# Patient Record
Sex: Female | Born: 1945 | Race: Black or African American | Hispanic: No | Marital: Married | Smoking: Former smoker
Health system: Southern US, Community
[De-identification: ages and names within clinical notes are randomized; demographics above are authoritative.]

## PROBLEM LIST (undated history)

## (undated) DIAGNOSIS — E785 Hyperlipidemia, unspecified: Secondary | ICD-10-CM

## (undated) DIAGNOSIS — J302 Other seasonal allergic rhinitis: Secondary | ICD-10-CM

## (undated) DIAGNOSIS — Z86018 Personal history of other benign neoplasm: Secondary | ICD-10-CM

## (undated) DIAGNOSIS — I10 Essential (primary) hypertension: Secondary | ICD-10-CM

## (undated) DIAGNOSIS — M659 Unspecified synovitis and tenosynovitis, unspecified site: Secondary | ICD-10-CM

## (undated) DIAGNOSIS — K649 Unspecified hemorrhoids: Secondary | ICD-10-CM

## (undated) DIAGNOSIS — R0600 Dyspnea, unspecified: Secondary | ICD-10-CM

## (undated) DIAGNOSIS — D126 Benign neoplasm of colon, unspecified: Secondary | ICD-10-CM

## (undated) DIAGNOSIS — J449 Chronic obstructive pulmonary disease, unspecified: Secondary | ICD-10-CM

## (undated) DIAGNOSIS — M064 Inflammatory polyarthropathy: Secondary | ICD-10-CM

## (undated) DIAGNOSIS — J45909 Unspecified asthma, uncomplicated: Secondary | ICD-10-CM

## (undated) HISTORY — PX: COLONOSCOPY: SHX174

## (undated) HISTORY — PX: EYE SURGERY: SHX253

## (undated) HISTORY — PX: ABDOMINAL HYSTERECTOMY: SHX81

## (undated) HISTORY — PX: OTHER SURGICAL HISTORY: SHX169

## (undated) HISTORY — PX: CATARACT EXTRACTION, BILATERAL: SHX1313

## (undated) HISTORY — PX: NASAL SINUS SURGERY: SHX719

---

## 1993-11-03 HISTORY — PX: NASAL SINUS SURGERY: SHX719

## 2004-03-22 ENCOUNTER — Other Ambulatory Visit: Payer: Self-pay

## 2005-06-10 ENCOUNTER — Emergency Department: Payer: Self-pay | Admitting: Emergency Medicine

## 2006-03-20 ENCOUNTER — Emergency Department: Payer: Self-pay | Admitting: Emergency Medicine

## 2007-09-16 ENCOUNTER — Ambulatory Visit: Payer: Self-pay | Admitting: Family Medicine

## 2008-07-31 ENCOUNTER — Ambulatory Visit: Payer: Self-pay | Admitting: Rheumatology

## 2009-03-07 ENCOUNTER — Emergency Department: Payer: Self-pay | Admitting: Emergency Medicine

## 2009-09-20 ENCOUNTER — Ambulatory Visit: Payer: Self-pay | Admitting: Gastroenterology

## 2010-07-02 ENCOUNTER — Ambulatory Visit: Payer: Self-pay | Admitting: Family Medicine

## 2011-03-07 ENCOUNTER — Ambulatory Visit: Payer: Self-pay | Admitting: Anesthesiology

## 2011-03-07 DIAGNOSIS — I10 Essential (primary) hypertension: Secondary | ICD-10-CM

## 2011-03-13 ENCOUNTER — Ambulatory Visit: Payer: Self-pay | Admitting: Podiatry

## 2011-07-15 ENCOUNTER — Encounter: Payer: Self-pay | Admitting: Rheumatology

## 2011-08-04 ENCOUNTER — Encounter: Payer: Self-pay | Admitting: Rheumatology

## 2011-10-01 ENCOUNTER — Ambulatory Visit: Payer: Self-pay | Admitting: Family Medicine

## 2012-12-07 ENCOUNTER — Ambulatory Visit: Payer: Self-pay | Admitting: Family Medicine

## 2015-01-24 ENCOUNTER — Ambulatory Visit: Payer: Self-pay | Admitting: Family Medicine

## 2015-04-16 ENCOUNTER — Ambulatory Visit: Admission: RE | Admit: 2015-04-16 | Payer: Self-pay | Source: Ambulatory Visit | Admitting: Gastroenterology

## 2015-04-16 ENCOUNTER — Encounter: Admission: RE | Payer: Self-pay | Source: Ambulatory Visit

## 2015-04-16 SURGERY — COLONOSCOPY
Anesthesia: General

## 2015-10-09 ENCOUNTER — Ambulatory Visit: Admission: RE | Admit: 2015-10-09 | Payer: PPO | Source: Ambulatory Visit | Admitting: Gastroenterology

## 2015-10-09 ENCOUNTER — Encounter: Admission: RE | Payer: Self-pay | Source: Ambulatory Visit

## 2015-10-09 SURGERY — COLONOSCOPY WITH PROPOFOL
Anesthesia: General

## 2016-02-05 ENCOUNTER — Other Ambulatory Visit: Payer: Self-pay | Admitting: Orthopedic Surgery

## 2016-02-05 DIAGNOSIS — M5416 Radiculopathy, lumbar region: Secondary | ICD-10-CM

## 2016-02-26 ENCOUNTER — Ambulatory Visit
Admission: RE | Admit: 2016-02-26 | Discharge: 2016-02-26 | Disposition: A | Discharge status: Home / Self Care | Payer: Medicare HMO | Source: Ambulatory Visit | Attending: Orthopedic Surgery | Admitting: Orthopedic Surgery

## 2016-02-26 DIAGNOSIS — M5136 Other intervertebral disc degeneration, lumbar region: Secondary | ICD-10-CM | POA: Diagnosis not present

## 2016-02-26 DIAGNOSIS — M5416 Radiculopathy, lumbar region: Secondary | ICD-10-CM

## 2016-09-22 ENCOUNTER — Other Ambulatory Visit: Payer: Self-pay | Admitting: Family Medicine

## 2016-09-22 DIAGNOSIS — Z1231 Encounter for screening mammogram for malignant neoplasm of breast: Secondary | ICD-10-CM

## 2016-10-31 ENCOUNTER — Ambulatory Visit: Payer: Medicare HMO

## 2016-11-12 ENCOUNTER — Ambulatory Visit: Payer: Medicare HMO

## 2016-11-20 ENCOUNTER — Ambulatory Visit: Payer: Medicare HMO

## 2016-11-25 ENCOUNTER — Ambulatory Visit
Admission: RE | Admit: 2016-11-25 | Discharge: 2016-11-25 | Disposition: A | Discharge status: Home / Self Care | Payer: Medicare PPO | Source: Ambulatory Visit | Attending: Family Medicine | Admitting: Family Medicine

## 2016-11-25 DIAGNOSIS — Z1231 Encounter for screening mammogram for malignant neoplasm of breast: Secondary | ICD-10-CM | POA: Insufficient documentation

## 2017-02-09 ENCOUNTER — Encounter: Payer: Self-pay | Admitting: *Deleted

## 2017-02-10 ENCOUNTER — Ambulatory Visit: Payer: Medicare PPO | Admitting: Anesthesiology

## 2017-02-10 ENCOUNTER — Encounter
Admission: RE | Disposition: A | Discharge status: Home / Self Care | Payer: Self-pay | Source: Ambulatory Visit | Attending: Gastroenterology

## 2017-02-10 ENCOUNTER — Encounter: Payer: Self-pay | Admitting: Anesthesiology

## 2017-02-10 ENCOUNTER — Ambulatory Visit
Admission: RE | Admit: 2017-02-10 | Discharge: 2017-02-10 | Disposition: A | Discharge status: Home / Self Care | Payer: Medicare PPO | Source: Ambulatory Visit | Attending: Gastroenterology | Admitting: Gastroenterology

## 2017-02-10 DIAGNOSIS — J45909 Unspecified asthma, uncomplicated: Secondary | ICD-10-CM | POA: Diagnosis not present

## 2017-02-10 DIAGNOSIS — M064 Inflammatory polyarthropathy: Secondary | ICD-10-CM | POA: Insufficient documentation

## 2017-02-10 DIAGNOSIS — I10 Essential (primary) hypertension: Secondary | ICD-10-CM | POA: Insufficient documentation

## 2017-02-10 DIAGNOSIS — D125 Benign neoplasm of sigmoid colon: Secondary | ICD-10-CM | POA: Insufficient documentation

## 2017-02-10 DIAGNOSIS — Z79899 Other long term (current) drug therapy: Secondary | ICD-10-CM | POA: Insufficient documentation

## 2017-02-10 DIAGNOSIS — Z8719 Personal history of other diseases of the digestive system: Secondary | ICD-10-CM | POA: Diagnosis present

## 2017-02-10 DIAGNOSIS — M1990 Unspecified osteoarthritis, unspecified site: Secondary | ICD-10-CM | POA: Insufficient documentation

## 2017-02-10 DIAGNOSIS — Z7982 Long term (current) use of aspirin: Secondary | ICD-10-CM | POA: Insufficient documentation

## 2017-02-10 DIAGNOSIS — Z87891 Personal history of nicotine dependence: Secondary | ICD-10-CM | POA: Insufficient documentation

## 2017-02-10 DIAGNOSIS — Q438 Other specified congenital malformations of intestine: Secondary | ICD-10-CM | POA: Diagnosis not present

## 2017-02-10 DIAGNOSIS — K5730 Diverticulosis of large intestine without perforation or abscess without bleeding: Secondary | ICD-10-CM | POA: Diagnosis not present

## 2017-02-10 HISTORY — DX: Essential (primary) hypertension: I10

## 2017-02-10 HISTORY — DX: Other seasonal allergic rhinitis: J30.2

## 2017-02-10 HISTORY — DX: Unspecified hemorrhoids: K64.9

## 2017-02-10 HISTORY — DX: Inflammatory polyarthropathy: M06.4

## 2017-02-10 HISTORY — PX: COLONOSCOPY WITH PROPOFOL: SHX5780

## 2017-02-10 SURGERY — COLONOSCOPY WITH PROPOFOL
Anesthesia: General

## 2017-02-10 MED ORDER — EPHEDRINE SULFATE-NACL 50-0.9 MG/10ML-% IV SOSY
PREFILLED_SYRINGE | INTRAVENOUS | Status: DC | PRN
  Administered 2017-02-10 (×3): 10 mg via INTRAVENOUS

## 2017-02-10 MED ORDER — PROPOFOL 10 MG/ML IV BOLUS
INTRAVENOUS | Status: DC | PRN
  Administered 2017-02-10: 20 mg via INTRAVENOUS
  Administered 2017-02-10: 60 mg via INTRAVENOUS
  Administered 2017-02-10: 20 mg via INTRAVENOUS

## 2017-02-10 MED ORDER — SODIUM CHLORIDE 0.9 % IV SOLN
INTRAVENOUS | Status: DC
  Administered 2017-02-10: 15:00:00 via INTRAVENOUS

## 2017-02-10 MED ORDER — PROPOFOL 500 MG/50ML IV EMUL
INTRAVENOUS | Status: DC | PRN
Start: 2017-02-10 — End: 2017-02-10
  Administered 2017-02-10: 200 ug/kg/min via INTRAVENOUS

## 2017-02-10 MED ORDER — EPHEDRINE SULFATE 50 MG/ML IJ SOLN
INTRAMUSCULAR | Status: AC
  Filled 2017-02-10: qty 1

## 2017-02-10 MED ORDER — SODIUM CHLORIDE 0.9 % IV SOLN: INTRAVENOUS | Status: DC

## 2017-02-10 MED ORDER — PROPOFOL 500 MG/50ML IV EMUL
INTRAVENOUS | Status: AC
  Filled 2017-02-10: qty 50

## 2017-02-10 MED ORDER — FENTANYL CITRATE (PF) 100 MCG/2ML IJ SOLN
INTRAMUSCULAR | Status: AC
  Filled 2017-02-10: qty 2

## 2017-02-10 MED ORDER — FENTANYL CITRATE (PF) 100 MCG/2ML IJ SOLN
INTRAMUSCULAR | Status: DC | PRN
  Administered 2017-02-10: 25 ug via INTRAVENOUS
  Administered 2017-02-10: 50 ug via INTRAVENOUS

## 2017-02-10 NOTE — Op Note (Signed)
[facility] Gastroenterology Patient Name: Angela Mccall Procedure Date: 02/10/2017 2:47 PM MRN: [id number] Account #: [id number] Date of Birth: 1946/07/17 Admit Type: Outpatient Age: 71 Room: [facility] ENDO ROOM 3 Gender: Female Note Status: Finalized Procedure:            Colonoscopy Indications:          history of rectal carcinoid. Providers:            Lollie Sails, MD Referring MD:         Irven Easterly. Kary Kos, MD (Referring MD) Medicines:            Monitored Anesthesia Care Complications:        No immediate complications. Procedure:            Pre-Anesthesia Assessment:                       - ASA Grade Assessment: III - A patient with severe                        systemic disease.                       After obtaining informed consent, the colonoscope was                        passed under direct vision. Throughout the procedure,                        the patient's blood pressure, pulse, and oxygen                        saturations were monitored continuously. The                        Colonoscope was introduced through the anus and                        advanced to the the cecum, identified by appendiceal                        orifice and ileocecal valve. The colonoscopy was                        unusually difficult due to restricted mobility of the                        colon, significant looping and a tortuous colon.                        Successful completion of the procedure was aided by                        changing the patient to a supine position and using                        manual pressure. The quality of the bowel preparation                        was good except the ascending colon was poor. Findings:      A 4 mm polyp was found  in the proximal sigmoid colon. The polyp was       sessile. The polyp was removed with a cold snare. Resection and       retrieval were complete.      A few small-mouthed diverticula were found in the  sigmoid colon and       descending colon.      The sigmoid colon and descending colon were significantly redundant.      The digital rectal exam was normal.      no evidence of rectal lesion such as carcinoid. Impression:           - One 4 mm polyp in the proximal sigmoid colon, removed                        with a cold snare. Resected and retrieved.                       - Diverticulosis in the sigmoid colon and in the                        descending colon.                       - Redundant colon. Recommendation:       - Discharge patient to home.                       - Await pathology results.                       - Telephone GI clinic for pathology results in 1 week. Procedure Code(s):    --- Professional ---                       [phone], Colonoscopy, flexible; with removal of tumor(s),                        polyp(s), or other lesion(s) by snare technique Diagnosis Code(s):    --- Professional ---                       D12.5, Benign neoplasm of sigmoid colon                       K57.30, Diverticulosis of large intestine without                        perforation or abscess without bleeding                       Q43.8, Other specified congenital malformations of                        intestine CPT copyright 2016 American Medical Association. All rights reserved. The codes documented in this report are preliminary and upon coder review may  be revised to meet current compliance requirements. Lollie Sails, MD 02/10/2017 3:45:01 PM This report has been signed electronically. Number of Addenda: 0 Note Initiated On: 02/10/2017 2:47 PM Scope Withdrawal Time: 0 hours 14 minutes 49 seconds  Total Procedure Duration: 0 hours 32 minutes 49 seconds       [facility]

## 2017-02-10 NOTE — Transfer of Care (Signed)
Immediate Anesthesia Transfer of Care Note  Patient: Angela Mccall  Procedure(s) Performed: Procedure(s): COLONOSCOPY WITH PROPOFOL (N/A)  Patient Location: PACU and Endoscopy Unit  Anesthesia Type:General  Level of Consciousness: awake, alert  and oriented  Airway & Oxygen Therapy: Patient Spontanous Breathing and Patient connected to nasal cannula oxygen  Post-op Assessment: Report given to RN and Post -op Vital signs reviewed and stable  Post vital signs: Reviewed and stable  Last Vitals:  Vitals:   02/10/17 1239 02/10/17 1545  BP: (!) 207/62 (!) 137/46  Pulse:  91  Resp:  18  Temp:  36.3 C    Last Pain:  Vitals:   02/10/17 1545  TempSrc: Tympanic  PainSc:          Complications: No apparent anesthesia complications

## 2017-02-10 NOTE — Anesthesia Post-op Follow-up Note (Cosign Needed)
Anesthesia QCDR form completed.        

## 2017-02-10 NOTE — Anesthesia Preprocedure Evaluation (Signed)
Anesthesia Evaluation  Patient identified by MRN, date of birth, ID band Patient awake    Reviewed: Allergy & Precautions, H&P , NPO status , Patient's Chart, lab work & pertinent test results  History of Anesthesia Complications Negative for: history of anesthetic complications  Airway Mallampati: II  TM Distance: >3 FB Neck ROM: full    Dental  (+) Poor Dentition, Chipped, Caps   Pulmonary neg shortness of breath, asthma , former smoker,    Pulmonary exam normal breath sounds clear to auscultation       Cardiovascular Exercise Tolerance: Good hypertension, (-) angina(-) Past MI and (-) DOE Normal cardiovascular exam Rhythm:regular Rate:Normal     Neuro/Psych negative neurological ROS  negative psych ROS   GI/Hepatic negative GI ROS, Neg liver ROS, neg GERD  ,  Endo/Other  negative endocrine ROS  Renal/GU negative Renal ROS  negative genitourinary   Musculoskeletal  (+) Arthritis ,   Abdominal   Peds  Hematology negative hematology ROS (+)   Anesthesia Other Findings Signs and symptoms suggestive of sleep apnea   Past Medical History: No date: Hemorrhoids No date: Hypertension 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) No date: Seasonal allergies  Past Surgical History: No date: ABDOMINAL HYSTERECTOMY No date: COLONOSCOPY No date: NASAL SINUS SURGERY No date: pneumovax  BMI    Body Mass Index:  34.94 kg/m      Reproductive/Obstetrics negative OB ROS                             Anesthesia Physical Anesthesia Plan  ASA: III  Anesthesia Plan: General   Post-op Pain Management:    Induction: Intravenous  Airway Management Planned: Natural Airway  Additional Equipment:   Intra-op Plan:   Post-operative Plan:   Informed Consent: I have reviewed the patients History and Physical, chart, labs and discussed the procedure including the risks, benefits and  alternatives for the proposed anesthesia with the patient or authorized representative who has indicated his/her understanding and acceptance.   Dental Advisory Given  Plan Discussed with: Anesthesiologist, CRNA and Surgeon  Anesthesia Plan Comments:         Anesthesia Quick Evaluation

## 2017-02-10 NOTE — Anesthesia Postprocedure Evaluation (Signed)
Anesthesia Post Note  Patient: Angela Mccall  Procedure(s) Performed: Procedure(s) (LRB): COLONOSCOPY WITH PROPOFOL (N/A)  Patient location during evaluation: Endoscopy Anesthesia Type: General Level of consciousness: awake and alert Pain management: pain level controlled Vital Signs Assessment: post-procedure vital signs reviewed and stable Respiratory status: spontaneous breathing and respiratory function stable Cardiovascular status: stable Anesthetic complications: no     Last Vitals:  Vitals:   02/10/17 1239 02/10/17 1545  BP: (!) 207/62 (!) 137/46  Pulse:  91  Resp:  18  Temp:  36.3 C    Last Pain:  Vitals:   02/10/17 1545  TempSrc: Tympanic  PainSc:                  KEPHART,WILLIAM K

## 2017-02-10 NOTE — H&P (Signed)
Outpatient short stay form Pre-procedure 02/10/2017 2:47 PM Lollie Sails MD  Primary Physician: Dr. Maryland Pink  Reason for visit:  Colonoscopy  History of present illness:  Patient is a 71 year old female presenting today as above. She has personal history of rectal carcinoid that was diagnosed in 2004. Repeat colonoscopy in 2010 showed no recurrence of this rectal carcinoid. There were several small hyperplastic polyps. Tolerated her prep well. She takes no attending agents with the exception 81 mg aspirin which she is held for a couple days.    Current Facility-Administered Medications:  .  0.9 %  sodium chloride infusion, , Intravenous, Continuous, Lollie Sails, MD .  0.9 %  sodium chloride infusion, , Intravenous, Continuous, Lollie Sails, MD  Prescriptions Prior to Admission  Medication Sig Dispense Refill Last Dose  . aspirin EC 81 MG tablet Take 81 mg by mouth daily.   02/10/2017 at Unknown time  . beclomethasone (QVAR) 80 MCG/ACT inhaler Inhale 2 puffs into the lungs daily.   Past Week at Unknown time  . folic acid (FOLVITE) 1 MG tablet Take 1 mg by mouth daily.   Past Week at Unknown time  . furosemide (LASIX) 20 MG tablet Take 20 mg by mouth.   02/10/2017 at Unknown time  . lisinopril-hydrochlorothiazide (PRINZIDE,ZESTORETIC) 20-25 MG tablet Take 1 tablet by mouth daily.   02/10/2017 at Unknown time  . Multiple Vitamin (MULTIVITAMIN) capsule Take 1 capsule by mouth daily.   Past Week at Unknown time  . omega-3 acid ethyl esters (LOVAZA) 1 g capsule Take by mouth 2 (two) times daily.   Past Week at Unknown time  . omeprazole (PRILOSEC) 20 MG capsule Take 20 mg by mouth daily.   Past Week at Unknown time     No Known Allergies   Past Medical History:  Diagnosis Date  . Hemorrhoids   . Hypertension   . Inflammatory polyarthropathy ([place])   . Seasonal allergies     Review of systems:      Physical Exam    Heart and lungs: Regular rate and rhythm without  rub or gallop, lungs are bilaterally clear.    HEENT: Normocephalic atraumatic eyes are anicteric    Other:     Pertinant exam for procedure: Soft nontender nondistended bowel sounds positive normoactive.    Planned proceedures: Colonoscopy and indicated procedures. I have discussed the risks benefits and complications of procedures to include not limited to bleeding, infection, perforation and the risk of sedation and the patient wishes to proceed.    Lollie Sails, MD Gastroenterology 02/10/2017  2:47 PM

## 2017-02-12 LAB — SURGICAL PATHOLOGY

## 2017-11-27 ENCOUNTER — Other Ambulatory Visit: Payer: Self-pay | Admitting: Family Medicine

## 2017-11-27 DIAGNOSIS — Z1231 Encounter for screening mammogram for malignant neoplasm of breast: Secondary | ICD-10-CM

## 2017-12-15 ENCOUNTER — Ambulatory Visit
Admission: RE | Admit: 2017-12-15 | Discharge: 2017-12-15 | Disposition: A | Discharge status: Home / Self Care | Payer: Medicare PPO | Source: Ambulatory Visit | Attending: Family Medicine | Admitting: Family Medicine

## 2017-12-15 DIAGNOSIS — Z1231 Encounter for screening mammogram for malignant neoplasm of breast: Secondary | ICD-10-CM | POA: Insufficient documentation

## 2017-12-31 ENCOUNTER — Ambulatory Visit
Admission: RE | Admit: 2017-12-31 | Discharge: 2017-12-31 | Disposition: A | Discharge status: Home / Self Care | Payer: Medicare PPO | Source: Ambulatory Visit | Attending: Cardiology | Admitting: Cardiology

## 2017-12-31 ENCOUNTER — Encounter
Admission: RE | Disposition: A | Discharge status: Home / Self Care | Payer: Self-pay | Source: Ambulatory Visit | Attending: Cardiology

## 2017-12-31 DIAGNOSIS — Z823 Family history of stroke: Secondary | ICD-10-CM | POA: Insufficient documentation

## 2017-12-31 DIAGNOSIS — R0600 Dyspnea, unspecified: Secondary | ICD-10-CM | POA: Insufficient documentation

## 2017-12-31 DIAGNOSIS — R9439 Abnormal result of other cardiovascular function study: Secondary | ICD-10-CM | POA: Diagnosis not present

## 2017-12-31 DIAGNOSIS — J449 Chronic obstructive pulmonary disease, unspecified: Secondary | ICD-10-CM | POA: Diagnosis not present

## 2017-12-31 DIAGNOSIS — J3081 Allergic rhinitis due to animal (cat) (dog) hair and dander: Secondary | ICD-10-CM | POA: Diagnosis not present

## 2017-12-31 DIAGNOSIS — Z9109 Other allergy status, other than to drugs and biological substances: Secondary | ICD-10-CM | POA: Diagnosis not present

## 2017-12-31 DIAGNOSIS — R079 Chest pain, unspecified: Secondary | ICD-10-CM | POA: Diagnosis not present

## 2017-12-31 DIAGNOSIS — Z7982 Long term (current) use of aspirin: Secondary | ICD-10-CM | POA: Insufficient documentation

## 2017-12-31 DIAGNOSIS — Z8249 Family history of ischemic heart disease and other diseases of the circulatory system: Secondary | ICD-10-CM | POA: Insufficient documentation

## 2017-12-31 DIAGNOSIS — Z85038 Personal history of other malignant neoplasm of large intestine: Secondary | ICD-10-CM | POA: Diagnosis not present

## 2017-12-31 DIAGNOSIS — E785 Hyperlipidemia, unspecified: Secondary | ICD-10-CM | POA: Diagnosis not present

## 2017-12-31 DIAGNOSIS — Z90710 Acquired absence of both cervix and uterus: Secondary | ICD-10-CM | POA: Diagnosis not present

## 2017-12-31 DIAGNOSIS — Z87891 Personal history of nicotine dependence: Secondary | ICD-10-CM | POA: Insufficient documentation

## 2017-12-31 DIAGNOSIS — R609 Edema, unspecified: Secondary | ICD-10-CM | POA: Diagnosis not present

## 2017-12-31 DIAGNOSIS — I10 Essential (primary) hypertension: Secondary | ICD-10-CM | POA: Diagnosis not present

## 2017-12-31 DIAGNOSIS — Z79899 Other long term (current) drug therapy: Secondary | ICD-10-CM | POA: Diagnosis not present

## 2017-12-31 HISTORY — PX: LEFT HEART CATH AND CORONARY ANGIOGRAPHY: CATH118249

## 2017-12-31 SURGERY — LEFT HEART CATH AND CORONARY ANGIOGRAPHY
Anesthesia: Moderate Sedation | Laterality: Left

## 2017-12-31 MED ORDER — LISINOPRIL 10 MG PO TABS
20.0000 mg | ORAL_TABLET | Freq: Every day | ORAL | Status: DC
  Administered 2017-12-31: 20 mg via ORAL

## 2017-12-31 MED ORDER — SODIUM CHLORIDE 0.9 % WEIGHT BASED INFUSION
220.5000 mL/h | INTRAVENOUS | Status: AC
  Administered 2017-12-31: 3 mL/kg/h via INTRAVENOUS

## 2017-12-31 MED ORDER — METOPROLOL TARTRATE 5 MG/5ML IV SOLN
INTRAVENOUS | Status: DC | PRN
  Administered 2017-12-31: 2.5 mg via INTRAVENOUS

## 2017-12-31 MED ORDER — HYDROCHLOROTHIAZIDE 25 MG PO TABS
25.0000 mg | ORAL_TABLET | Freq: Every day | ORAL | Status: DC
  Administered 2017-12-31: 25 mg via ORAL
  Filled 2017-12-31 (×2): qty 1

## 2017-12-31 MED ORDER — FENTANYL CITRATE (PF) 100 MCG/2ML IJ SOLN
INTRAMUSCULAR | Status: DC | PRN
  Administered 2017-12-31: 25 ug via INTRAVENOUS

## 2017-12-31 MED ORDER — ASPIRIN 81 MG PO CHEW: 81.0000 mg | CHEWABLE_TABLET | ORAL | Status: DC

## 2017-12-31 MED ORDER — MIDAZOLAM HCL 2 MG/2ML IJ SOLN
INTRAMUSCULAR | Status: DC | PRN
  Administered 2017-12-31: 1 mg via INTRAVENOUS

## 2017-12-31 MED ORDER — ONDANSETRON HCL 4 MG/2ML IJ SOLN: 4.0000 mg | Freq: Four times a day (QID) | INTRAMUSCULAR | Status: DC | PRN

## 2017-12-31 MED ORDER — LISINOPRIL 10 MG PO TABS
ORAL_TABLET | ORAL | Status: AC
  Administered 2017-12-31: 20 mg via ORAL
  Filled 2017-12-31: qty 2

## 2017-12-31 MED ORDER — ACETAMINOPHEN 325 MG PO TABS: 650.0000 mg | ORAL_TABLET | ORAL | Status: DC | PRN

## 2017-12-31 MED ORDER — SODIUM CHLORIDE 0.9 % WEIGHT BASED INFUSION: 73.5000 mL/h | INTRAVENOUS | Status: DC

## 2017-12-31 MED ORDER — ASPIRIN 81 MG PO CHEW
CHEWABLE_TABLET | ORAL | Status: AC
  Administered 2017-12-31: 81 mg
  Filled 2017-12-31: qty 1

## 2017-12-31 MED ORDER — SODIUM CHLORIDE 0.9 % IV SOLN: 250.0000 mL | INTRAVENOUS | Status: DC | PRN

## 2017-12-31 MED ORDER — MIDAZOLAM HCL 2 MG/2ML IJ SOLN
INTRAMUSCULAR | Status: AC
  Filled 2017-12-31: qty 2

## 2017-12-31 MED ORDER — SODIUM CHLORIDE 0.9% FLUSH: 3.0000 mL | INTRAVENOUS | Status: DC | PRN

## 2017-12-31 MED ORDER — SODIUM CHLORIDE 0.9 % WEIGHT BASED INFUSION: 1.0000 mL/kg/h | INTRAVENOUS | Status: AC

## 2017-12-31 MED ORDER — FENTANYL CITRATE (PF) 100 MCG/2ML IJ SOLN
INTRAMUSCULAR | Status: AC
  Filled 2017-12-31: qty 2

## 2017-12-31 MED ORDER — IOPAMIDOL (ISOVUE-300) INJECTION 61%
INTRAVENOUS | Status: DC | PRN
  Administered 2017-12-31: 85 mL via INTRA_ARTERIAL

## 2017-12-31 MED ORDER — SODIUM CHLORIDE 0.9% FLUSH: 3.0000 mL | Freq: Two times a day (BID) | INTRAVENOUS | Status: DC

## 2017-12-31 MED ORDER — HEPARIN (PORCINE) IN NACL 2-0.9 UNIT/ML-% IJ SOLN
INTRAMUSCULAR | Status: AC
  Filled 2017-12-31: qty 500

## 2017-12-31 MED ORDER — METOPROLOL TARTRATE 5 MG/5ML IV SOLN
INTRAVENOUS | Status: AC
  Filled 2017-12-31: qty 5

## 2017-12-31 SURGICAL SUPPLY — 9 items
CATH INFINITI 5FR ANG PIGTAIL (CATHETERS) ×2 IMPLANT
CATH INFINITI 5FR JL4 (CATHETERS) ×2 IMPLANT
CATH INFINITI JR4 5F (CATHETERS) ×2 IMPLANT
DEVICE CLOSURE MYNXGRIP 5F (Vascular Products) ×2 IMPLANT
KIT MANI 3VAL PERCEP (MISCELLANEOUS) ×2 IMPLANT
NEEDLE PERC 18GX7CM (NEEDLE) ×2 IMPLANT
PACK CARDIAC CATH (CUSTOM PROCEDURE TRAY) ×2 IMPLANT
SHEATH AVANTI 5FR X 11CM (SHEATH) ×2 IMPLANT
WIRE GUIDERIGHT .035X150 (WIRE) ×2 IMPLANT

## 2017-12-31 NOTE — Discharge Instructions (Signed)
Moderate Conscious Sedation, Adult, Care After These instructions provide you with information about caring for yourself after your procedure. Your health care provider may also give you more specific instructions. Your treatment has been planned according to current medical practices, but problems sometimes occur. Call your health care provider if you have any problems or questions after your procedure. What can I expect after the procedure? After your procedure, it is common:  To feel sleepy for several hours.  To feel clumsy and have poor balance for several hours.  To have poor judgment for several hours.  To vomit if you eat too soon.  Follow these instructions at home: For at least 24 hours after the procedure:   Do not: ? Participate in activities where you could fall or become injured. ? Drive. ? Use heavy machinery. ? Drink alcohol. ? Take sleeping pills or medicines that cause drowsiness. ? Make important decisions or sign legal documents. ? Take care of children on your own.  Rest. Eating and drinking  Follow the diet recommended by your health care provider.  If you vomit: ? Drink water, juice, or soup when you can drink without vomiting. ? Make sure you have little or no nausea before eating solid foods. General instructions  Have a responsible adult stay with you until you are awake and alert.  Take over-the-counter and prescription medicines only as told by your health care provider.  If you smoke, do not smoke without supervision.  Keep all follow-up visits as told by your health care provider. This is important. Contact a health care provider if:  You keep feeling nauseous or you keep vomiting.  You feel light-headed.  You develop a rash.  You have a fever. Get help right away if:  You have trouble breathing. This information is not intended to replace advice given to you by your health care provider. Make sure you discuss any questions you have  with your health care provider. Document Released: 08/10/2013 Document Revised: 03/24/2016 Document Reviewed: 02/09/2016 Elsevier Interactive Patient Education  2018 [employer]. Coronary Angioplasty, Care After This sheet gives you information about how to care for yourself after your procedure. Your health care provider may also give you more specific instructions. If you have problems or questions, contact your health care provider. What can I expect after the procedure? After your procedure, it is common to have:  Bruising at the catheter insertion site. This usually fades within 1-2 weeks.  Blood collecting in the tissue (hematoma) that may be painful to the touch. It should become smaller and less tender within 1-2 weeks.  Follow these instructions at home: Medicines  Take over-the-counter and prescription medicines only as told by your health care provider.  Blood thinners may be prescribed after your procedure to improve blood flow. Bathing  You may shower 24-48 hours after the procedure or as told by your health care provider.  Do not take baths, swim, or use a hot tub until your health care provider approves. Insertion site care  Follow instructions from your health care provider about how to take care of your insertion site. Make sure you: ? Wash your hands with soap and water before you change your bandage (dressing). If soap and water are not available, use hand sanitizer. ? Change your dressing as told by your health care provider. ? Gently wash the site with plain soap and water. ? Use a clean towel to pat the area dry. ? Do not rub the site, because this  may cause bleeding. ? Do not apply powder or lotion to the site.  Check your insertion site every day for signs of infection. Check for: ? More redness, swelling, or pain. ? More fluid or blood. ? Warmth. ? Pus or a bad smell. Lifestyle  Make any lifestyle changes as recommended by your health care provider.  This may include: ? Not using any products that contain nicotine or tobacco, such as cigarettes and e-cigarettes. If you need help quitting, ask your health care provider. ? Managing your weight. ? Getting regular exercise. ? Managing your blood pressure. ? Limiting your alcohol intake. ? Managing other health problems, such as diabetes.  Eat a heart-healthy diet. This should include plenty of fresh fruits and vegetables. Avoid foods that are: ? High in salt (sodium). ? Canned or highly processed. ? High in saturated fat or sugar. ? Fried. General instructions  Do not lift over 10 lb (4.5 kg) for 5 days after your procedure or as told by your health care provider.  Ask your health care provider when it is okay to: ? Return to work or school. ? Resume usual physical activities or sports. ? Resume sexual activity.  Keep all follow-up visits as told by your health care provider. This is important. Contact a health care provider if:  You have a fever.  You have chills.  You have increased bleeding from the insertion site. Hold pressure on the site. Get help right away if:  You develop chest pain or shortness of breath, feel faint, or pass out.  You have unusual pain at the insertion site.  You have redness, warmth, or swelling at the insertion site.  You have drainage (other than a small amount of blood on the dressing) from the insertion site.  The insertion site is bleeding, and the bleeding does not stop after 30 minutes of holding steady pressure on the site.  You develop bleeding from any other place, such as from the rectum. There may be bright red blood in your urine or stool, or it may appear as black, tarry stool. This information is not intended to replace advice given to you by your health care provider. Make sure you discuss any questions you have with your health care provider. Document Released: 05/08/2005 Document Revised: 02/16/2017 Document Reviewed:  05/25/2016 Elsevier Interactive Patient Education  [employer].

## 2018-05-03 ENCOUNTER — Encounter (INDEPENDENT_AMBULATORY_CARE_PROVIDER_SITE_OTHER): Payer: Medicare PPO | Admitting: Vascular Surgery

## 2018-05-20 ENCOUNTER — Encounter (INDEPENDENT_AMBULATORY_CARE_PROVIDER_SITE_OTHER): Payer: Medicare PPO | Admitting: Vascular Surgery

## 2018-06-30 ENCOUNTER — Encounter (INDEPENDENT_AMBULATORY_CARE_PROVIDER_SITE_OTHER): Payer: Self-pay | Admitting: Vascular Surgery

## 2018-06-30 ENCOUNTER — Ambulatory Visit (INDEPENDENT_AMBULATORY_CARE_PROVIDER_SITE_OTHER): Payer: Medicare PPO | Admitting: Vascular Surgery

## 2018-06-30 VITALS — BP 185/71 | HR 64 | Resp 16 | Ht 59.5 in | Wt 169.0 lb

## 2018-06-30 DIAGNOSIS — M79605 Pain in left leg: Secondary | ICD-10-CM | POA: Diagnosis not present

## 2018-06-30 DIAGNOSIS — R600 Localized edema: Secondary | ICD-10-CM | POA: Diagnosis not present

## 2018-06-30 DIAGNOSIS — M79604 Pain in right leg: Secondary | ICD-10-CM

## 2018-06-30 NOTE — Progress Notes (Signed)
Subjective:    Patient ID: Angela Mccall, female    DOB: [birth year], 72 y.o.   MRN: [id number] Chief Complaint  Patient presents with  . New Patient (Initial Visit)    ref for varicose veins   Presents as a new patient referred by [facility] for evaluation of bilateral lower extremity pain and edema.  The patient endorses a long-standing history of bilateral lower extremity edema however states this has progressively worsened since May 2019.  When the patient's edema worsen this may, the patient noticed an increase in pain located to her legs.  The pain is most uncomfortable at night.  The patient notes that her swelling worsens towards the end of the day or with sitting and standing for long periods of time.  The patient denies any recent surgery or trauma or DVT history.  The patient denies any claudication-like symptoms, rest pain or ulcer formation to the bilateral lower extremity.  The patient's discomfort is mostly a intermittent cramping in the calves at night.  At this time, patient does not engage in conservative therapy including wearing medical grade 1 compression socks, elevating her legs or remaining active on a daily basis.  Patient denies any fever, nausea vomiting.  Review of Systems  Constitutional: Negative.   HENT: Negative.   Eyes: Negative.   Respiratory: Negative.   Cardiovascular: Positive for leg swelling.       Lower Extremity Pain  Gastrointestinal: Negative.   Endocrine: Negative.   Genitourinary: Negative.   Musculoskeletal: Negative.   Skin: Negative.   Allergic/Immunologic: Negative.   Neurological: Negative.   Hematological: Negative.   Psychiatric/Behavioral: Negative.       Objective:   Physical Exam  Constitutional: She is oriented to person, place, and time. She appears well-developed and well-nourished. No distress.  HENT:  Head: Normocephalic and atraumatic.  Right Ear: External ear normal.  Left Ear: External ear normal.  Eyes: Pupils  are equal, round, and reactive to light. Conjunctivae and EOM are normal.  Neck: Normal range of motion.  Cardiovascular: Normal rate, regular rhythm, normal heart sounds and intact distal pulses.  Pulses:      Radial pulses are 2+ on the right side, and 2+ on the left side.  Hard to palpate pedal pulses due to edema and body habitus however bilateral feet are warm  Pulmonary/Chest: Effort normal and breath sounds normal.  Musculoskeletal: Normal range of motion. She exhibits edema (Mild to moderate nonpitting edema noted bilaterally).  Neurological: She is alert and oriented to person, place, and time.  Skin: Skin is warm and dry. She is not diaphoretic.  This is dermatitis noted to the bilateral feet.  There is no fibrosis, cellulitis or active ulcerations noted at this time.  Diffuse less than 1cm varicosities noted to bilateral legs.  Psychiatric: She has a normal mood and affect. Her behavior is normal. Judgment and thought content normal.  Vitals reviewed.  BP (!) 185/71 (BP Location: Right Arm)   Pulse 64   Resp 16   Ht 4' 11.5" (1.511 m)   Wt 169 lb (76.7 kg)   BMI 33.56 kg/m   Past Medical History:  Diagnosis Date  . Hemorrhoids   . Hypertension   . Inflammatory polyarthropathy ([place])   . Seasonal allergies    Social History   Socioeconomic History  . Marital status: Married    Spouse name: Not on file  . Number of children: Not on file  . Years of education: Not on file  .  Highest education level: Not on file  Occupational History  . Not on file  Social Needs  . Financial resource strain: Not on file  . Food insecurity:    Worry: Not on file    Inability: Not on file  . Transportation needs:    Medical: Not on file    Non-medical: Not on file  Tobacco Use  . Smoking status: Former Smoker    Packs/day: 1.00    Years: 11.00    Pack years: 11.00    Last attempt to quit: 11/03/1973    Years since quitting: 44.6  . Smokeless tobacco: Never Used  Substance and  Sexual Activity  . Alcohol use: No  . Drug use: No  . Sexual activity: Not Currently  Lifestyle  . Physical activity:    Days per week: Not on file    Minutes per session: Not on file  . Stress: Not on file  Relationships  . Social connections:    Talks on phone: Not on file    Gets together: Not on file    Attends religious service: Not on file    Active member of club or organization: Not on file    Attends meetings of clubs or organizations: Not on file    Relationship status: Not on file  . Intimate partner violence:    Fear of current or ex partner: Not on file    Emotionally abused: Not on file    Physically abused: Not on file    Forced sexual activity: Not on file  Other Topics Concern  . Not on file  Social History Narrative  . Not on file   Past Surgical History:  Procedure Laterality Date  . ABDOMINAL HYSTERECTOMY    . COLONOSCOPY    . COLONOSCOPY WITH PROPOFOL N/A 02/10/2017   Procedure: COLONOSCOPY WITH PROPOFOL;  Surgeon: Lollie Sails, MD;  Location: [facility] ENDOSCOPY;  Service: Endoscopy;  Laterality: N/A;  . LEFT HEART CATH AND CORONARY ANGIOGRAPHY Left 12/31/2017   Procedure: LEFT HEART CATH AND CORONARY ANGIOGRAPHY;  Surgeon: Isaias Cowman, MD;  Location: [place] CV LAB;  Service: Cardiovascular;  Laterality: Left;  . NASAL SINUS SURGERY    . pneumovax     Family History  Problem Relation Age of Onset  . Breast cancer Neg Hx    No Known Allergies     Assessment & Plan:  Presents as a new patient referred by [facility] for evaluation of bilateral lower extremity pain and edema.  The patient endorses a long-standing history of bilateral lower extremity edema however states this has progressively worsened since May 2019.  When the patient's edema worsen this may, the patient noticed an increase in pain located to her legs.  The pain is most uncomfortable at night.  The patient notes that her swelling worsens towards the end of the day or with  sitting and standing for long periods of time.  The patient denies any recent surgery or trauma or DVT history.  The patient denies any claudication-like symptoms, rest pain or ulcer formation to the bilateral lower extremity.  The patient's discomfort is mostly a intermittent cramping in the calves at night.  At this time, patient does not engage in conservative therapy including wearing medical grade 1 compression socks, elevating her legs or remaining active on a daily basis.  Patient denies any fever, nausea vomiting.  1. Bilateral lower extremity edema - New The patient was encouraged to wear graduated compression stockings (20-30 mmHg) on a  daily basis. The patient was instructed to begin wearing the stockings first thing in the morning and removing them in the evening. The patient was instructed specifically not to sleep in the stockings. Prescription given.  In addition, behavioral modification including elevation during the day will be initiated. Anti-inflammatories for pain. I will bring the patient back and have her undergo bilateral lower extremity venous duplex to rule any contributing venous versus lymphatic disease. The patient will follow up in three months to asses conservative management.  The patient was instructed to call the office in the interim if any worsening edema or ulcerations to the legs, feet or toes occurs. The patient expresses their understanding.  - VAS [place] LOWER EXTREMITY VENOUS REFLUX; Future  2. Lower extremity pain, bilateral - New Patient with multiple risk factors for peripheral artery disease Hard to palpate pedal pulses on exam I will bring patient back and have her undergo bilateral ABI to assess for any contributing peripheral artery disease I have discussed with the patient at length the risk factors for and pathogenesis of atherosclerotic disease and encouraged a healthy diet, regular exercise regimen and blood pressure / glucose control.  The patient  was encouraged to call the office in the interim if he experiences any claudication like symptoms, rest pain or ulcers to his feet / toes.  - VAS [place] ABI WITH/WO TBI; Future  Current Outpatient Medications on File Prior to Visit  Medication Sig Dispense Refill  . aspirin EC 81 MG tablet Take 81 mg by mouth daily.    . folic acid (FOLVITE) 1 MG tablet Take 1 mg by mouth daily.    Marland Kitchen lisinopril-hydrochlorothiazide (PRINZIDE,ZESTORETIC) 20-25 MG tablet Take 1 tablet by mouth daily.    . Multiple Vitamin (MULTIVITAMIN) capsule Take 1 capsule by mouth daily.    Marland Kitchen omega-3 acid ethyl esters (LOVAZA) 1 g capsule Take 1 g by mouth 2 (two) times daily.      No current facility-administered medications on file prior to visit.     There are no Patient Instructions on file for this visit. No follow-ups on file.   Chioma Mukherjee A Danetta Prom, PA-C

## 2018-08-17 ENCOUNTER — Ambulatory Visit (INDEPENDENT_AMBULATORY_CARE_PROVIDER_SITE_OTHER): Payer: Medicare PPO

## 2018-08-17 ENCOUNTER — Ambulatory Visit (INDEPENDENT_AMBULATORY_CARE_PROVIDER_SITE_OTHER): Payer: Medicare PPO | Admitting: Vascular Surgery

## 2018-08-17 ENCOUNTER — Encounter (INDEPENDENT_AMBULATORY_CARE_PROVIDER_SITE_OTHER): Payer: Self-pay | Admitting: Vascular Surgery

## 2018-08-17 ENCOUNTER — Encounter

## 2018-08-17 VITALS — BP 192/74 | HR 62 | Resp 17 | Ht 60.0 in | Wt 168.0 lb

## 2018-08-17 DIAGNOSIS — I10 Essential (primary) hypertension: Secondary | ICD-10-CM

## 2018-08-17 DIAGNOSIS — M79604 Pain in right leg: Secondary | ICD-10-CM | POA: Diagnosis not present

## 2018-08-17 DIAGNOSIS — M79605 Pain in left leg: Secondary | ICD-10-CM | POA: Diagnosis not present

## 2018-08-17 DIAGNOSIS — Z87891 Personal history of nicotine dependence: Secondary | ICD-10-CM | POA: Diagnosis not present

## 2018-08-17 DIAGNOSIS — R600 Localized edema: Secondary | ICD-10-CM

## 2018-08-17 NOTE — Assessment & Plan Note (Signed)
Noninvasive studies today showed normal ABIs of 1.08 on the right and 1.04 on the left.  Venous duplex shows no evidence of deep venous thrombosis, superficial thrombophlebitis, or significant venous reflux in either lower extremity. Given the arthritis in her left knee, I think this is precipitating some of the swelling and has likely led to lymphedema of the left lower extremity.  This would be stage I-II lymphedema.  I recommend she continue wearing compression stockings and elevating her legs.  I recommended increasing her activity as tolerated.  I think a lymphedema pump would be an excellent adjuvant therapy and we will try to obtain 1 of these today.  We will see her back in several months to assess her response and determine if further adjuvant therapies would be helpful.

## 2018-08-17 NOTE — Patient Instructions (Signed)

## 2018-08-17 NOTE — Assessment & Plan Note (Signed)
blood pressure control important in reducing the progression of atherosclerotic disease. On appropriate oral medications.  

## 2018-08-17 NOTE — Progress Notes (Signed)
MRN : [id number]  Angela Mccall is a 72 y.o. (08/27/1946) female who presents with chief complaint of  Chief Complaint  Patient presents with  . Follow-up    ABI follow up  .  History of Present Illness: Patient returns today in follow up of leg pain and swelling.  Her left leg continues to swell and bother her although it is a little bit better since her first visit with the use of compression stockings and elevation.  No new ulceration or infection.  Noninvasive studies today showed normal ABIs of 1.08 on the right and 1.04 on the left.  Venous duplex shows no evidence of deep venous thrombosis, superficial thrombophlebitis, or significant venous reflux in either lower extremity.  Current Outpatient Medications  Medication Sig Dispense Refill  . albuterol (PROVENTIL HFA;VENTOLIN HFA) 108 (90 Base) MCG/ACT inhaler Inhale into the lungs.    Marland Kitchen aspirin EC 81 MG tablet Take 81 mg by mouth daily.    . cetirizine (ZYRTEC) 10 MG tablet Take by mouth.    . fluticasone (FLONASE) 50 MCG/ACT nasal spray Place into the nose.    . folic acid (FOLVITE) 1 MG tablet Take 1 mg by mouth daily.    . furosemide (LASIX) 20 MG tablet Take by mouth.    Marland Kitchen lisinopril-hydrochlorothiazide (PRINZIDE,ZESTORETIC) 20-25 MG tablet Take 1 tablet by mouth daily.    . meloxicam (MOBIC) 7.5 MG tablet Take by mouth.    . Multiple Vitamin (MULTIVITAMIN) capsule Take 1 capsule by mouth daily.    Angela Mccall 3-6-9 CAPS Take by mouth.    . omega-3 acid ethyl esters (LOVAZA) 1 g capsule Take 1 g by mouth 2 (two) times daily.     Marland Kitchen omeprazole (PRILOSEC) 20 MG capsule Take by mouth.     No current facility-administered medications for this visit.     Past Medical History:  Diagnosis Date  . Hemorrhoids   . Hypertension   . Inflammatory polyarthropathy ([place])   . Seasonal allergies     Past Surgical History:  Procedure Laterality Date  . ABDOMINAL HYSTERECTOMY    . COLONOSCOPY    . COLONOSCOPY WITH PROPOFOL N/A  02/10/2017   Procedure: COLONOSCOPY WITH PROPOFOL;  Surgeon: Lollie Sails, MD;  Location: [facility] ENDOSCOPY;  Service: Endoscopy;  Laterality: N/A;  . LEFT HEART CATH AND CORONARY ANGIOGRAPHY Left 12/31/2017   Procedure: LEFT HEART CATH AND CORONARY ANGIOGRAPHY;  Surgeon: Isaias Cowman, MD;  Location: [place] CV LAB;  Service: Cardiovascular;  Laterality: Left;  . NASAL SINUS SURGERY    . pneumovax      Social History Social History   Tobacco Use  . Smoking status: Former Smoker    Packs/day: 1.00    Years: 11.00    Pack years: 11.00    Last attempt to quit: 11/03/1973    Years since quitting: 44.8  . Smokeless tobacco: Never Used  Substance Use Topics  . Alcohol use: No  . Drug use: No     Family History Family History  Problem Relation Age of Onset  . Breast cancer Neg Hx      No Known Allergies   REVIEW OF SYSTEMS (Negative unless checked)  Constitutional: [] Weight loss  [] Fever  [] Chills Cardiac: [] Chest pain   [] Chest pressure   [] Palpitations   [] Shortness of breath when laying flat   [] Shortness of breath at rest   [] Shortness of breath with exertion. Vascular:  [x] Pain in legs with walking   [x] Pain in legs at  rest   [] Pain in legs when laying flat   [] Claudication   [] Pain in feet when walking  [] Pain in feet at rest  [] Pain in feet when laying flat   [] History of DVT   [] Phlebitis   [x] Swelling in legs   [x] Varicose veins   [] Non-healing ulcers Pulmonary:   [] Uses home oxygen   [] Productive cough   [] Hemoptysis   [] Wheeze  [] COPD   [] Asthma Neurologic:  [] Dizziness  [] Blackouts   [] Seizures   [] History of stroke   [] History of TIA  [] Aphasia   [] Temporary blindness   [] Dysphagia   [] Weakness or numbness in arms   [] Weakness or numbness in legs Musculoskeletal:  [x] Arthritis   [] Joint swelling   [] Joint pain   [] Low back pain Hematologic:  [] Easy bruising  [] Easy bleeding   [] Hypercoagulable state   [] Anemic   Gastrointestinal:  [] Blood in stool    [] Vomiting blood  [] Gastroesophageal reflux/heartburn   [] Abdominal pain Genitourinary:  [] Chronic kidney disease   [] Difficult urination  [] Frequent urination  [] Burning with urination   [] Hematuria Skin:  [] Rashes   [] Ulcers   [] Wounds Psychological:  [] History of anxiety   []  History of major depression.  Physical Examination  BP (!) 192/74 (BP Location: Right Arm, Patient Position: Sitting)   Pulse 62   Resp 17   Ht 5' (1.524 m)   Wt 168 lb (76.2 kg)   BMI 32.81 kg/m  Gen:  WD/WN, NAD Head: [place]/AT, No temporalis wasting. Ear/Nose/Throat: Hearing grossly intact, nares w/o erythema or drainage Eyes: Conjunctiva clear. Sclera non-icteric Neck: Supple.  Trachea midline Pulmonary:  Good air movement, no use of accessory muscles.  Cardiac: RRR, no JVD Vascular:  Vessel Right Left  Radial Palpable Palpable                          PT  1+ palpable  1+ palpable  DP Palpable  1+ palpable   Musculoskeletal: M/S 5/5 throughout.  No deformity or atrophy.  Trace right lower extremity edema, 1+ left lower extremity edema. Neurologic: Sensation grossly intact in extremities.  Symmetrical.  Speech is fluent.  Psychiatric: Judgment intact, Mood & affect appropriate for pt's clinical situation. Dermatologic: No rashes or ulcers noted.  No cellulitis or open wounds.       Labs No results found for this or any previous visit (from the past 2160 hour(s)).  Radiology No results found.  Assessment/Plan  Hypertension blood pressure control important in reducing the progression of atherosclerotic disease. On appropriate oral medications.   Bilateral lower extremity edema Noninvasive studies today showed normal ABIs of 1.08 on the right and 1.04 on the left.  Venous duplex shows no evidence of deep venous thrombosis, superficial thrombophlebitis, or significant venous reflux in either lower extremity. Given the arthritis in her left knee, I think this is precipitating some of the  swelling and has likely led to lymphedema of the left lower extremity.  This would be stage I-II lymphedema.  I recommend she continue wearing compression stockings and elevating her legs.  I recommended increasing her activity as tolerated.  I think a lymphedema pump would be an excellent adjuvant therapy and we will try to obtain 1 of these today.  We will see her back in several months to assess her response and determine if further adjuvant therapies would be helpful.    Leotis Pain, MD  08/17/2018 4:15 PM    This note was created with Dragon medical transcription  system.  Any errors from dictation are purely unintentional

## 2018-11-16 ENCOUNTER — Ambulatory Visit (INDEPENDENT_AMBULATORY_CARE_PROVIDER_SITE_OTHER): Payer: Medicare PPO | Admitting: Vascular Surgery

## 2018-11-16 ENCOUNTER — Encounter (INDEPENDENT_AMBULATORY_CARE_PROVIDER_SITE_OTHER): Payer: Medicare PPO

## 2018-11-26 ENCOUNTER — Ambulatory Visit (INDEPENDENT_AMBULATORY_CARE_PROVIDER_SITE_OTHER): Payer: Medicare HMO | Admitting: Vascular Surgery

## 2018-11-26 ENCOUNTER — Encounter (INDEPENDENT_AMBULATORY_CARE_PROVIDER_SITE_OTHER): Payer: Self-pay | Admitting: Vascular Surgery

## 2018-11-26 VITALS — BP 195/67 | HR 62 | Resp 16 | Ht 61.0 in | Wt 171.6 lb

## 2018-11-26 DIAGNOSIS — R600 Localized edema: Secondary | ICD-10-CM

## 2018-11-26 DIAGNOSIS — I890 Lymphedema, not elsewhere classified: Secondary | ICD-10-CM | POA: Insufficient documentation

## 2018-11-26 DIAGNOSIS — I10 Essential (primary) hypertension: Secondary | ICD-10-CM

## 2018-11-26 NOTE — Patient Instructions (Signed)

## 2018-11-26 NOTE — Assessment & Plan Note (Signed)
Continue compression, elevation, and the lymphedema pump.  A new prescription for pantyhose compression was given today.  Return to clinic in 6 months

## 2018-11-26 NOTE — Progress Notes (Signed)
MRN : [id number]  Angela Mccall is a 73 y.o. (August 02, 1946) female who presents with chief complaint of  Chief Complaint  Patient presents with  . Follow-up  .  History of Present Illness: Patient returns today in follow up of leg swelling and lymphedema.  She has been using her lymphedema pump and this has definitely helped her legs feel some better.  She does still have significant swelling worse in the left leg and the right leg.  No ulceration or infection.  The legs may be slightly better than they were 3 months ago but not dramatically so.  She is also still bothered by knee pain and knee swelling.  She had a hard time with the stockings that were prescribed previously and requests pantyhose compression at this point.  She thinks these will stay up better.  Current Outpatient Medications  Medication Sig Dispense Refill  . albuterol (PROVENTIL HFA;VENTOLIN HFA) 108 (90 Base) MCG/ACT inhaler Inhale into the lungs.    Marland Kitchen aspirin EC 81 MG tablet Take 81 mg by mouth daily.    . fluticasone (FLONASE) 50 MCG/ACT nasal spray Place into the nose.    . folic acid (FOLVITE) 1 MG tablet Take 1 mg by mouth daily.    . furosemide (LASIX) 20 MG tablet Take by mouth.    Marland Kitchen lisinopril-hydrochlorothiazide (PRINZIDE,ZESTORETIC) 20-25 MG tablet Take 1 tablet by mouth daily.    . Multiple Vitamin (MULTIVITAMIN) capsule Take 1 capsule by mouth daily.     No current facility-administered medications for this visit.     Past Medical History:  Diagnosis Date  . Hemorrhoids   . Hypertension   . Inflammatory polyarthropathy ([place])   . Seasonal allergies     Past Surgical History:  Procedure Laterality Date  . ABDOMINAL HYSTERECTOMY    . COLONOSCOPY    . COLONOSCOPY WITH PROPOFOL N/A 02/10/2017   Procedure: COLONOSCOPY WITH PROPOFOL;  Surgeon: Lollie Sails, MD;  Location[facility] ENDOSCOPY;  Service: Endoscopy;  Laterality: N/A;  . LEFT HEART CATH AND CORONARY ANGIOGRAPHY Left 12/31/2017   Procedure:  LEFT HEART CATH AND CORONARY ANGIOGRAPHY;  Surgeon: Isaias Cowman, MD;  Location: [place] CV LAB;  Service: Cardiovascular;  Laterality: Left;  . NASAL SINUS SURGERY    . pneumovax     Social History        Tobacco Use  . Smoking status: Former Smoker    Packs/day: 1.00    Years: 11.00    Pack years: 11.00    Last attempt to quit: 11/03/1973    Years since quitting: 44.8  . Smokeless tobacco: Never Used  Substance Use Topics  . Alcohol use: No  . Drug use: No     Family History      Family History  Problem Relation Age of Onset  . Breast cancer Neg Hx      No Known Allergies   REVIEW OF SYSTEMS (Negative unless checked)  Constitutional: [] ?Weight loss  [] ?Fever  [] ?Chills Cardiac: [] ?Chest pain   [] ?Chest pressure   [] ?Palpitations   [] ?Shortness of breath when laying flat   [] ?Shortness of breath at rest   [] ?Shortness of breath with exertion. Vascular:  [x] ?Pain in legs with walking   [x] ?Pain in legs at rest   [] ?Pain in legs when laying flat   [] ?Claudication   [] ?Pain in feet when walking  [] ?Pain in feet at rest  [] ?Pain in feet when laying flat   [] ?History of DVT   [] ?Phlebitis   [x] ?Swelling  in legs   [x] ?Varicose veins   [] ?Non-healing ulcers Pulmonary:   [] ?Uses home oxygen   [] ?Productive cough   [] ?Hemoptysis   [] ?Wheeze  [] ?COPD   [] ?Asthma Neurologic:  [] ?Dizziness  [] ?Blackouts   [] ?Seizures   [] ?History of stroke   [] ?History of TIA  [] ?Aphasia   [] ?Temporary blindness   [] ?Dysphagia   [] ?Weakness or numbness in arms   [] ?Weakness or numbness in legs Musculoskeletal:  [x] ?Arthritis   [] ?Joint swelling   [] ?Joint pain   [] ?Low back pain Hematologic:  [] ?Easy bruising  [] ?Easy bleeding   [] ?Hypercoagulable state   [] ?Anemic   Gastrointestinal:  [] ?Blood in stool   [] ?Vomiting blood  [] ?Gastroesophageal reflux/heartburn   [] ?Abdominal pain Genitourinary:  [] ?Chronic kidney disease   [] ?Difficult urination  [] ?Frequent urination   [] ?Burning with urination   [] ?Hematuria Skin:  [] ?Rashes   [] ?Ulcers   [] ?Wounds Psychological:  [] ?History of anxiety   [] ? History of major depression.    Physical Examination  BP (!) 195/67 (BP Location: Right Arm, Patient Position: Sitting)   Pulse 62   Resp 16   Ht 5\' 1"  (1.549 m)   Wt 171 lb 9.6 oz (77.8 kg)   BMI 32.42 kg/m  Gen:  WD/WN, NAD Head: [place]/AT, No temporalis wasting. Ear/Nose/Throat: Hearing grossly intact, nares w/o erythema or drainage Eyes: Conjunctiva clear. Sclera non-icteric Neck: Supple.  Trachea midline Pulmonary:  Good air movement, no use of accessory muscles.  Cardiac: RRR, no JVD Vascular:  Vessel Right Left  Radial Palpable Palpable                          PT 1+ Palpable Trace Palpable  DP Palpable Palpable    Musculoskeletal: M/S 5/5 throughout.  No deformity or atrophy. 1+ RLE edema, 1-2+ LLE edema. Neurologic: Sensation grossly intact in extremities.  Symmetrical.  Speech is fluent.  Psychiatric: Judgment intact, Mood & affect appropriate for pt's clinical situation. Dermatologic: No rashes or ulcers noted.  No cellulitis or open wounds.       Labs No results found for this or any previous visit (from the past 2160 hour(s)).  Radiology No results found.  Assessment/Plan Hypertension blood pressure control important in reducing the progression of atherosclerotic disease. On appropriate oral medications.  Bilateral lower extremity edema Continue compression, elevation, and the lymphedema pump.  A new prescription for pantyhose compression was given today.  Return to clinic in 6 months  Lymphedema Continue compression, elevation, and the lymphedema pump.  A new prescription for pantyhose compression was given today.  Return to clinic in 6 months    Leotis Pain, MD  11/26/2018 9:43 AM    This note was created with Dragon medical transcription system.  Any errors from dictation are purely unintentional

## 2019-05-27 ENCOUNTER — Ambulatory Visit (INDEPENDENT_AMBULATORY_CARE_PROVIDER_SITE_OTHER): Payer: Medicare HMO | Admitting: Vascular Surgery

## 2019-06-28 ENCOUNTER — Other Ambulatory Visit: Payer: Self-pay | Admitting: Family Medicine

## 2019-06-28 DIAGNOSIS — Z1231 Encounter for screening mammogram for malignant neoplasm of breast: Secondary | ICD-10-CM

## 2019-09-13 ENCOUNTER — Ambulatory Visit
Admission: RE | Admit: 2019-09-13 | Discharge: 2019-09-13 | Disposition: A | Discharge status: Home / Self Care | Payer: Medicare HMO | Source: Ambulatory Visit | Attending: Family Medicine | Admitting: Family Medicine

## 2019-09-13 DIAGNOSIS — Z1231 Encounter for screening mammogram for malignant neoplasm of breast: Secondary | ICD-10-CM | POA: Diagnosis not present

## 2019-09-19 ENCOUNTER — Other Ambulatory Visit: Payer: Self-pay | Admitting: Family Medicine

## 2019-09-19 DIAGNOSIS — R928 Other abnormal and inconclusive findings on diagnostic imaging of breast: Secondary | ICD-10-CM

## 2019-09-27 ENCOUNTER — Ambulatory Visit: Payer: Medicare HMO

## 2019-09-27 ENCOUNTER — Other Ambulatory Visit: Payer: Medicare HMO

## 2019-10-11 ENCOUNTER — Other Ambulatory Visit: Payer: Medicare HMO

## 2019-10-18 ENCOUNTER — Other Ambulatory Visit: Payer: Medicare HMO

## 2019-11-04 HISTORY — PX: CATARACT EXTRACTION, BILATERAL: SHX1313

## 2019-11-28 ENCOUNTER — Ambulatory Visit
Admission: RE | Admit: 2019-11-28 | Discharge: 2019-11-28 | Disposition: A | Discharge status: Home / Self Care | Payer: Medicare HMO | Source: Ambulatory Visit | Attending: Family Medicine | Admitting: Family Medicine

## 2019-11-28 DIAGNOSIS — R928 Other abnormal and inconclusive findings on diagnostic imaging of breast: Secondary | ICD-10-CM | POA: Insufficient documentation

## 2019-11-30 ENCOUNTER — Other Ambulatory Visit: Payer: Self-pay | Admitting: Family Medicine

## 2019-11-30 DIAGNOSIS — N6310 Unspecified lump in the right breast, unspecified quadrant: Secondary | ICD-10-CM

## 2020-04-30 ENCOUNTER — Other Ambulatory Visit: Payer: Self-pay | Admitting: Podiatry

## 2020-05-02 ENCOUNTER — Other Ambulatory Visit
Admission: RE | Admit: 2020-05-02 | Discharge: 2020-05-02 | Disposition: A | Discharge status: Home / Self Care | Payer: Medicare HMO | Source: Ambulatory Visit | Attending: Podiatry | Admitting: Podiatry

## 2020-05-02 ENCOUNTER — Other Ambulatory Visit: Payer: Self-pay

## 2020-05-02 DIAGNOSIS — Z20822 Contact with and (suspected) exposure to covid-19: Secondary | ICD-10-CM | POA: Insufficient documentation

## 2020-05-02 DIAGNOSIS — Z01818 Encounter for other preprocedural examination: Secondary | ICD-10-CM | POA: Insufficient documentation

## 2020-05-02 HISTORY — DX: Unspecified asthma, uncomplicated: J45.909

## 2020-05-02 NOTE — Patient Instructions (Signed)
COVID TESTING Date: May 03, 2020 Testing site:  [facility] [address] Thru Hours:  6:78 am - 1:00 pm Once you are tested, you are asked to stay quarantined (avoiding public places) until after your surgery.   Your procedure is scheduled on: Friday May 04, 2020 Report to Day Surgery on the 2nd floor of the [facility]. To find out your arrival time, please call [phone] between 1PM - 3PM on: Thursday May 03, 2020  REMEMBER: Instructions that are not followed completely may result in serious medical risk, up to and including death; or upon the discretion of your surgeon and anesthesiologist your surgery may need to be rescheduled.  Do not eat food after midnight the night before surgery.  No gum chewing, lozengers or hard candies.  You may however, drink CLEAR liquids up to 2 hours before you are scheduled to arrive for your surgery. Do not drink anything within 2 hours of your scheduled arrival time.  Clear liquids include: - water  - apple juice without pulp - gatorade (not RED) - black coffee or tea (Do NOT add milk or creamers to the coffee or tea) Do NOT drink anything that is not on this list.  Type 1 and Type 2 diabetics should only drink water.  ENSURE PRE-SURGERY CARBOHYDRATE DRINK:  Complete drinking 2 hours prior to scheduled arrival time.  TAKE THESE MEDICATIONS THE MORNING OF SURGERY WITH A SIP OF WATER: amlodipine  Use inhalers on the day of surgery    Stop Anti-inflammatories (NSAIDS) such as Advil, Aleve, Ibuprofen, Motrin, Naproxen, Naprosyn aspirin and Aspirin based products such as Excedrin, Goodys Powder, BC Powder. (May take Tylenol or Acetaminophen if needed.)  Stop ANY OVER THE COUNTER supplements until after surgery. Stop omega 3. (May continue Vitamin D, Vitamin B, and multivitamin.)  No Alcohol for 24 hours before or after surgery.  No Smoking including e-cigarettes for 24 hours prior to surgery.  No  chewable tobacco products for at least 6 hours prior to surgery.  No nicotine patches on the day of surgery.  Do not use any "recreational" drugs for at least a week prior to your surgery.  Please be advised that the combination of cocaine and anesthesia may have negative outcomes, up to and including death. If you test positive for cocaine, your surgery will be cancelled.  On the morning of surgery brush your teeth with toothpaste and water, you may rinse your mouth with mouthwash if you wish. Do not swallow any toothpaste or mouthwash.  Do not wear jewelry, make-up, hairpins, clips or nail polish.  Do not wear lotions, powders, or perfumes.   Do not shave 48 hours prior to surgery.   Contact lenses, hearing aids and dentures may not be worn into surgery.  Do not bring valuables to the hospital. [facility] is not responsible for any missing/lost belongings or valuables.   Use CHG Soap  as directed on instruction sheet.  Notify your doctor if there is any change in your medical condition (cold, fever, infection).  Wear comfortable clothing (specific to your surgery type) to the hospital.  Plan for stool softeners for home use; pain medications have a tendency to cause constipation. You can also help prevent constipation by eating foods high in fiber such as fruits and vegetables and drinking plenty of fluids as your diet allows.  After surgery, you can help prevent lung complications by doing breathing exercises.  Take deep breaths and cough every 1-2 hours.  Your doctor may order a device called an Incentive Spirometer to help you take deep breaths. When coughing or sneezing, hold a pillow firmly against your incision with both hands. This is called "splinting." Doing this helps protect your incision. It also decreases belly discomfort.  If you are being discharged the day of surgery, you will not be allowed to drive home. You will need a responsible adult (18 years or older) to  drive you home and stay with you that night.   Please call the [facility] Dept. at [phone] if you have any questions about these instructions.  Visitation Policy:  Patients undergoing a surgery or procedure may have one family member or support person with them as long as that person is not COVID-19 positive or experiencing its symptoms.  That person may remain in the waiting area during the procedure.  Children under 75 years of age may have both parents or legal guardians with them during their procedure.  Inpatient Visitation Update:   Two designated support people may visit a patient during visiting hours 7 am to 8 pm. It must be the same two designated people for the duration of the patient stay. The visitors may come and go during the day, and there is no switching out to have different visitors. A mask must be worn at all times, including in the patient room.  Children under 42 years of age:  a total of 4 designated visitors for the child's entire stay are allowed. Only 2 in the room at a time and only one staying overnight at a time. The overnight guest can now rotate during the child's hospital stay.  As a reminder, masks are still required for all [place] team members, patients and visitors in all [facility] [place] facilities.   Systemwide, no visitors 17 or younger.

## 2020-05-03 ENCOUNTER — Encounter: Payer: Self-pay | Admitting: Urgent Care

## 2020-05-03 ENCOUNTER — Other Ambulatory Visit
Admission: RE | Admit: 2020-05-03 | Discharge: 2020-05-03 | Disposition: A | Discharge status: Home / Self Care | Payer: Medicare HMO | Source: Ambulatory Visit | Attending: Podiatry | Admitting: Podiatry

## 2020-05-03 DIAGNOSIS — Z20822 Contact with and (suspected) exposure to covid-19: Secondary | ICD-10-CM | POA: Diagnosis not present

## 2020-05-03 DIAGNOSIS — Z01818 Encounter for other preprocedural examination: Secondary | ICD-10-CM | POA: Diagnosis not present

## 2020-05-03 LAB — SARS CORONAVIRUS 2 (TAT 6-24 HRS): SARS Coronavirus 2: NEGATIVE

## 2020-05-04 ENCOUNTER — Ambulatory Visit: Payer: Medicare HMO | Admitting: Anesthesiology

## 2020-05-04 ENCOUNTER — Encounter: Payer: Self-pay | Admitting: Podiatry

## 2020-05-04 ENCOUNTER — Ambulatory Visit
Admission: RE | Admit: 2020-05-04 | Discharge: 2020-05-04 | Disposition: A | Discharge status: Home / Self Care | Payer: Medicare HMO | Attending: Podiatry | Admitting: Podiatry

## 2020-05-04 ENCOUNTER — Encounter
Admission: RE | Disposition: A | Discharge status: Home / Self Care | Payer: Self-pay | Source: Home / Self Care | Attending: Podiatry

## 2020-05-04 ENCOUNTER — Other Ambulatory Visit: Payer: Self-pay

## 2020-05-04 DIAGNOSIS — Y793 Surgical instruments, materials and orthopedic devices (including sutures) associated with adverse incidents: Secondary | ICD-10-CM | POA: Diagnosis not present

## 2020-05-04 DIAGNOSIS — I10 Essential (primary) hypertension: Secondary | ICD-10-CM | POA: Diagnosis not present

## 2020-05-04 DIAGNOSIS — Z87891 Personal history of nicotine dependence: Secondary | ICD-10-CM | POA: Insufficient documentation

## 2020-05-04 DIAGNOSIS — Z91048 Other nonmedicinal substance allergy status: Secondary | ICD-10-CM | POA: Insufficient documentation

## 2020-05-04 DIAGNOSIS — M064 Inflammatory polyarthropathy: Secondary | ICD-10-CM | POA: Insufficient documentation

## 2020-05-04 DIAGNOSIS — Z7982 Long term (current) use of aspirin: Secondary | ICD-10-CM | POA: Diagnosis not present

## 2020-05-04 DIAGNOSIS — T8484XA Pain due to internal orthopedic prosthetic devices, implants and grafts, initial encounter: Secondary | ICD-10-CM | POA: Insufficient documentation

## 2020-05-04 DIAGNOSIS — Z79899 Other long term (current) drug therapy: Secondary | ICD-10-CM | POA: Insufficient documentation

## 2020-05-04 DIAGNOSIS — Z09 Encounter for follow-up examination after completed treatment for conditions other than malignant neoplasm: Secondary | ICD-10-CM

## 2020-05-04 DIAGNOSIS — J45909 Unspecified asthma, uncomplicated: Secondary | ICD-10-CM | POA: Diagnosis not present

## 2020-05-04 HISTORY — PX: MINOR HARDWARE REMOVAL: SHX6474

## 2020-05-04 SURGERY — MINOR HARDWARE REMOVAL
Anesthesia: General | Laterality: Bilateral

## 2020-05-04 MED ORDER — LIDOCAINE HCL (CARDIAC) PF 100 MG/5ML IV SOSY
PREFILLED_SYRINGE | INTRAVENOUS | Status: DC | PRN
  Administered 2020-05-04: 60 mg via INTRAVENOUS

## 2020-05-04 MED ORDER — FAMOTIDINE 20 MG PO TABS: 20.0000 mg | ORAL_TABLET | Freq: Once | ORAL | Status: AC

## 2020-05-04 MED ORDER — ACETAMINOPHEN 10 MG/ML IV SOLN
INTRAVENOUS | Status: AC
  Filled 2020-05-04: qty 100

## 2020-05-04 MED ORDER — LACTATED RINGERS IV SOLN: INTRAVENOUS | Status: DC

## 2020-05-04 MED ORDER — ONDANSETRON HCL 4 MG/2ML IJ SOLN: 4.0000 mg | Freq: Once | INTRAMUSCULAR | Status: DC | PRN

## 2020-05-04 MED ORDER — HYDROCODONE-ACETAMINOPHEN 5-325 MG PO TABS: 1.0000 | ORAL_TABLET | Freq: Four times a day (QID) | ORAL | 0 refills | Status: AC | PRN

## 2020-05-04 MED ORDER — FENTANYL CITRATE (PF) 100 MCG/2ML IJ SOLN: 25.0000 ug | INTRAMUSCULAR | Status: DC | PRN

## 2020-05-04 MED ORDER — ONDANSETRON HCL 4 MG PO TABS: 4.0000 mg | ORAL_TABLET | Freq: Three times a day (TID) | ORAL | 0 refills | Status: AC | PRN

## 2020-05-04 MED ORDER — BUPIVACAINE HCL (PF) 0.5 % IJ SOLN
INTRAMUSCULAR | Status: DC | PRN
  Administered 2020-05-04: 20 mL

## 2020-05-04 MED ORDER — CHLORHEXIDINE GLUCONATE 0.12 % MT SOLN: 15.0000 mL | Freq: Once | OROMUCOSAL | Status: AC

## 2020-05-04 MED ORDER — CEFAZOLIN SODIUM-DEXTROSE 2-4 GM/100ML-% IV SOLN
INTRAVENOUS | Status: AC
  Filled 2020-05-04: qty 100

## 2020-05-04 MED ORDER — CEFAZOLIN SODIUM-DEXTROSE 2-4 GM/100ML-% IV SOLN
2.0000 g | INTRAVENOUS | Status: AC
  Administered 2020-05-04: 2 g via INTRAVENOUS

## 2020-05-04 MED ORDER — FAMOTIDINE 20 MG PO TABS
ORAL_TABLET | ORAL | Status: AC
  Administered 2020-05-04: 20 mg via ORAL
  Filled 2020-05-04: qty 1

## 2020-05-04 MED ORDER — PROPOFOL 10 MG/ML IV BOLUS
INTRAVENOUS | Status: DC | PRN
  Administered 2020-05-04: 200 mg via INTRAVENOUS

## 2020-05-04 MED ORDER — POVIDONE-IODINE 7.5 % EX SOLN
Freq: Once | CUTANEOUS | Status: DC
  Filled 2020-05-04: qty 118

## 2020-05-04 MED ORDER — EPHEDRINE SULFATE 50 MG/ML IJ SOLN
INTRAMUSCULAR | Status: DC | PRN
  Administered 2020-05-04: 10 mg via INTRAVENOUS

## 2020-05-04 MED ORDER — OXYCODONE HCL 5 MG/5ML PO SOLN: 5.0000 mg | Freq: Once | ORAL | Status: DC | PRN

## 2020-05-04 MED ORDER — FENTANYL CITRATE (PF) 100 MCG/2ML IJ SOLN
INTRAMUSCULAR | Status: DC | PRN
  Administered 2020-05-04: 50 ug via INTRAVENOUS
  Administered 2020-05-04: 25 ug via INTRAVENOUS
  Administered 2020-05-04: 50 ug via INTRAVENOUS

## 2020-05-04 MED ORDER — ACETAMINOPHEN 10 MG/ML IV SOLN
INTRAVENOUS | Status: DC | PRN
  Administered 2020-05-04: 1000 mg via INTRAVENOUS

## 2020-05-04 MED ORDER — ONDANSETRON HCL 4 MG/2ML IJ SOLN
INTRAMUSCULAR | Status: DC | PRN
  Administered 2020-05-04: 4 mg via INTRAVENOUS

## 2020-05-04 MED ORDER — LACTATED RINGERS IV SOLN: INTRAVENOUS | Status: DC | PRN

## 2020-05-04 MED ORDER — ORAL CARE MOUTH RINSE: 15.0000 mL | Freq: Once | OROMUCOSAL | Status: AC

## 2020-05-04 MED ORDER — CHLORHEXIDINE GLUCONATE 0.12 % MT SOLN
OROMUCOSAL | Status: AC
  Administered 2020-05-04: 15 mL via OROMUCOSAL
  Filled 2020-05-04: qty 15

## 2020-05-04 MED ORDER — PROPOFOL 500 MG/50ML IV EMUL
INTRAVENOUS | Status: AC
  Filled 2020-05-04: qty 50

## 2020-05-04 MED ORDER — DEXAMETHASONE SODIUM PHOSPHATE 10 MG/ML IJ SOLN
INTRAMUSCULAR | Status: DC | PRN
  Administered 2020-05-04: 10 mg via INTRAVENOUS

## 2020-05-04 MED ORDER — OXYCODONE HCL 5 MG PO TABS: 5.0000 mg | ORAL_TABLET | Freq: Once | ORAL | Status: DC | PRN

## 2020-05-04 MED ORDER — FENTANYL CITRATE (PF) 100 MCG/2ML IJ SOLN
INTRAMUSCULAR | Status: AC
  Filled 2020-05-04: qty 2

## 2020-05-04 SURGICAL SUPPLY — 39 items
BLADE SURG 15 STRL LF DISP TIS (BLADE) ×2 IMPLANT
BLADE SURG 15 STRL SS (BLADE) ×2
BLADE SURG MINI STRL (BLADE) IMPLANT
BNDG CONFORM 3 STRL LF (GAUZE/BANDAGES/DRESSINGS) IMPLANT
BNDG ELASTIC 4X5.8 VLCR NS LF (GAUZE/BANDAGES/DRESSINGS) ×6 IMPLANT
BNDG ESMARK 4X12 TAN STRL LF (GAUZE/BANDAGES/DRESSINGS) IMPLANT
CAST PADDING 3X4FT ST 30246 (SOFTGOODS)
COVER WAND RF STERILE (DRAPES) ×2 IMPLANT
CUFF TOURN SGL QUICK 12 (TOURNIQUET CUFF) IMPLANT
CUFF TOURN SGL QUICK 18X4 (TOURNIQUET CUFF) ×4 IMPLANT
DRAPE BILAT LIMB 76X120 89291 (MISCELLANEOUS) ×2 IMPLANT
DRAPE FLUOR MINI C-ARM 54X84 (DRAPES) ×2 IMPLANT
DURAPREP 26ML APPLICATOR (WOUND CARE) ×4 IMPLANT
ELECT REM PT RETURN 9FT ADLT (ELECTROSURGICAL) ×2
ELECTRODE REM PT RTRN 9FT ADLT (ELECTROSURGICAL) ×1 IMPLANT
GAUZE SPONGE 4X4 12PLY STRL (GAUZE/BANDAGES/DRESSINGS) ×2 IMPLANT
GAUZE XEROFORM 1X8 LF (GAUZE/BANDAGES/DRESSINGS) ×2 IMPLANT
GLOVE BIO SURGEON STRL SZ7 (GLOVE) ×2 IMPLANT
GLOVE BIO SURGEON STRL SZ7.5 (GLOVE) IMPLANT
GLOVE INDICATOR 7.0 STRL GRN (GLOVE) ×2 IMPLANT
GLOVE INDICATOR 8.0 STRL GRN (GLOVE) IMPLANT
GOWN STRL REUS W/ TWL LRG LVL3 (GOWN DISPOSABLE) ×1 IMPLANT
GOWN STRL REUS W/TWL LRG LVL3 (GOWN DISPOSABLE) ×1
NEEDLE FILTER BLUNT 18X 1/2SAF (NEEDLE) ×1
NEEDLE FILTER BLUNT 18X1 1/2 (NEEDLE) ×1 IMPLANT
NEEDLE HYPO 25X1 1.5 SAFETY (NEEDLE) ×6 IMPLANT
NS IRRIG 500ML POUR BTL (IV SOLUTION) ×2 IMPLANT
PACK EXTREMITY (MISCELLANEOUS) ×2 IMPLANT
PAD CAST CTTN 3X4 STRL (SOFTGOODS) IMPLANT
STOCKINETTE STRL 6IN 960660 (GAUZE/BANDAGES/DRESSINGS) ×2 IMPLANT
STRIP CLOSURE SKIN 1/4X4 (GAUZE/BANDAGES/DRESSINGS) IMPLANT
SUT ETHILON 4-0 (SUTURE) ×1
SUT ETHILON 4-0 FS2 18XMFL BLK (SUTURE) ×1
SUT VIC AB 4-0 FS2 27 (SUTURE) IMPLANT
SUTURE ETHLN 4-0 FS2 18XMF BLK (SUTURE) ×1 IMPLANT
SWABSTK COMLB BENZOIN TINCTURE (MISCELLANEOUS) IMPLANT
SYR 10ML LL (SYRINGE) ×4 IMPLANT
SYR 20ML LL LF (SYRINGE) ×2 IMPLANT
SYR 3ML LL SCALE MARK (SYRINGE) IMPLANT

## 2020-05-04 NOTE — H&P (Signed)
HISTORY AND PHYSICAL INTERVAL NOTE:  05/04/2020  7:20 AM  Angela Mccall  has presented today for surgery, with the diagnosis of [place] LEFT FOOT.  The various methods of treatment have been discussed with the patient.  No guarantees were given.  After consideration of risks, benefits and other options for treatment, the patient has consented to surgery.  I have reviewed the patients' chart and labs.    PROCEDURE: REMOVAL OF PAINFUL ORTHOPEDIC HARDWARE BOTH FEET   A history and physical examination was performed in my office.  The patient was reexamined.  There have been no changes to this history and physical examination.  Caroline More, DPM

## 2020-05-04 NOTE — Transfer of Care (Signed)
Immediate Anesthesia Transfer of Care Note  Patient: Angela Mccall  Procedure(s) Performed: HARDWARE REMOVAL BILATERAL FEET (Bilateral )  Patient Location: PACU  Anesthesia Type:General  Level of Consciousness: sedated  Airway & Oxygen Therapy: Patient Spontanous Breathing and Patient connected to face mask oxygen  Post-op Assessment: Report given to RN and Post -op Vital signs reviewed and stable  Post vital signs: Reviewed and stable  Last Vitals:  Vitals Value Taken Time  BP 155/60 05/04/20 0916  Temp 36.1 C 05/04/20 0916  Pulse 65 05/04/20 0916  Resp 16 05/04/20 0916  SpO2 100 % 05/04/20 0916    Last Pain:  Vitals:   05/04/20 0916  TempSrc: Temporal  PainSc: 0-No pain         Complications: No complications documented.

## 2020-05-04 NOTE — Anesthesia Procedure Notes (Signed)
Procedure Name: LMA Insertion Date/Time: 05/04/2020 7:35 AM Performed by: Justus Memory, CRNA Pre-anesthesia Checklist: Patient identified, Patient being monitored, Timeout performed, Emergency Drugs available and Suction available Patient Re-evaluated:Patient Re-evaluated prior to induction Oxygen Delivery Method: Circle system utilized Preoxygenation: Pre-oxygenation with 100% oxygen Induction Type: IV induction Ventilation: Mask ventilation without difficulty LMA: LMA inserted LMA Size: 4.0 Tube type: Oral Number of attempts: 1 Placement Confirmation: positive ETCO2 and breath sounds checked- equal and bilateral Tube secured with: Tape Dental Injury: Teeth and Oropharynx as per pre-operative assessment

## 2020-05-04 NOTE — Discharge Instructions (Signed)
AMBULATORY SURGERY  DISCHARGE INSTRUCTIONS   1) The drugs that you were given will stay in your system until tomorrow so for the next 24 hours you should not:  A) Drive an automobile B) Make any legal decisions C) Drink any alcoholic beverage   2) You may resume regular meals tomorrow.  Today it is better to start with liquids and gradually work up to solid foods.  You may eat anything you prefer, but it is better to start with liquids, then soup and crackers, and gradually work up to solid foods.   3) Please notify your doctor immediately if you have any unusual bleeding, trouble breathing, redness and pain at the surgery site, drainage, fever, or pain not relieved by medication. 4)   5) Your post-operative visit with Dr.                                     is: Date:                        Time:    Please call to schedule your post-operative visit.  6) Additional Instructions:      Angela Mccall  POST OPERATIVE INSTRUCTIONS FOR DR. TROXLER, DR. Vickki Muff, AND DR. [place]   1. Take your medication as prescribed.  Pain medication should be taken only as needed.  2. Keep the dressing clean, dry and intact.  Ambulate in surgical shoes to both feet.  We would like you to keep the dressings that were on after surgical procedure for the next week until your next appointment.   After the weekend you may remove the bandages if needed but need to apply a smaller bandage over the area and recommend applying triple antibiotic to the incision line.  Again we would highly recommend keeping the postoperative dressing that was applied after procedure on for the next week.  Make sure to wear surgical shoes when ambulating.  You are weightbearing as tolerated but should still try to stay off your feet as much as possible to ensure proper incision healing.  3. Keep your foot elevated above the heart level for the first 48  hours.  4. Walking to the bathroom and brief periods of walking are acceptable, unless we have instructed you to be non-weight bearing.  5. Always wear your post-op shoe when walking.  Always use your crutches if you are to be non-weight bearing.  6. Do not take a shower. Baths are permissible as long as the foot is kept out of the water.   7. Every hour you are awake:  - Bend your knee 15 times. - Flex foot 15 times - Massage calf 15 times  8. Call [facility] [phone]) if any of the following problems occur: - You develop a temperature or fever. - The bandage becomes saturated with blood. - Medication does not stop your pain. - Injury of the foot occurs. - Any symptoms of infection including redness, odor, or red streaks running from wound.

## 2020-05-04 NOTE — Anesthesia Postprocedure Evaluation (Signed)
Anesthesia Post Note  Patient: Angela Mccall  Procedure(s) Performed: HARDWARE REMOVAL BILATERAL FEET (Bilateral )  Patient location during evaluation: PACU Anesthesia Type: General Level of consciousness: awake and alert Pain management: pain level controlled Vital Signs Assessment: post-procedure vital signs reviewed and stable Respiratory status: spontaneous breathing, nonlabored ventilation and respiratory function stable Cardiovascular status: blood pressure returned to baseline and stable Postop Assessment: no apparent nausea or vomiting Anesthetic complications: no   No complications documented.   Last Vitals:  Vitals:   05/04/20 0902 05/04/20 0916  BP: (!) 143/58 (!) 155/60  Pulse: 64 65  Resp: (!) 21 16  Temp: (!) 36.3 C (!) 36.1 C  SpO2: 98% 100%    Last Pain:  Vitals:   05/04/20 0916  TempSrc: Temporal  PainSc: 0-No pain                 Brett Canales [name]

## 2020-05-04 NOTE — Op Note (Signed)
PODIATRY / FOOT AND ANKLE SURGERY OPERATIVE REPORT  SURGEON: Caroline More, DPM  PRE-OPERATIVE DIAGNOSIS: Painful orthopedic hardware distal first metatarsal bilateral  POST-OPERATIVE DIAGNOSIS: Same  PROCEDURE(S): 1. Removal of painful orthopedic hardware right foot 2. Removal of painful orthopedic hardware left foot  HEMOSTASIS: Left/right ankle tourniquet  ANESTHESIA: MAC  ESTIMATED BLOOD LOSS: 0 cc  FINDING(S): 1.  Prominent K wires from previous bunionectomy both feet at the dorsal medial aspect of the first metatarsal distally  PATHOLOGY/SPECIMEN(S): None  INDICATIONS:   Angela Mccall is a 74 y.o. female who presents with pain to the dorsal medial aspect of the first metatarsal distally in the area of prominent hardware.  Patient had procedure performed by Dr. Elvina Mattes number of years ago and states that the hardware has become more prominent and painful over the years.  She would like the hardware removed today.  Patient was sent by Dr. Elvina Mattes to myself for further evaluation care and for surgical intervention.  Discussed all treatment options with the patient both conservative and surgical attempts at correction including potential risks and complications of surgical intervention.  At this time patient is elected for surgical procedure consisting of removal of painful orthopedic hardware both feet.  Discussed postoperative course in detail as well as potential risks and complications.  Patient understands.  Patient agreeable and consent has been obtained and placed in chart.  DESCRIPTION: After obtaining full informed written consent, the patient was brought back to the operating room and placed supine upon the operating table.  The patient received IV antibiotics prior to induction.  After obtaining adequate anesthesia pneumatic ankle tourniquets were placed.  A Mayo block was performed at each foot with 10 cc of half percent Marcaine plain.  The patient was prepped and draped  in the standard fashion.    An Esmarch bandage was used to exsanguinate the right lower extremity and the pneumatic ankle tourniquet was inflated.  Attention was then directed to the dorsal medial aspect of the distal first metatarsal where the palpable prominent hardware was noted.  C-arm imaging was utilized to verify position of hardware.  At this time an incision was then made that was approximately 1 to 2 cm over the area of prominent hardware.  The incision was deepened to the subcutaneous tissues and utilizing sharp and blunt dissection care was taken to identify and retract all vital neurovascular structures all venous contributories were cauterized necessary.  At this time a deep incision was made into the periosteal level of tissue and the periosteum was reflected medially and laterally in the K wire was then exposed.  The K wire appeared to be loose and was easily removed from its placement.  The surgical site was flushed with copious amounts normal sterile saline.  The skin was then reapproximated well coapted with 4-0 nylon combination of simple and horizontal mattress type stitching.  The pneumatic ankle tourniquet was deflated to the right side.  An Esmarch bandage used to exsanguinate the left lower extremity and the pneumatic ankle tourniquet was inflated.  Attention was then directed to the dorsal medial aspect of the distal first metatarsal where the palpable prominent hardware was noted.  C-arm imaging was utilized to verify position of hardware.  At this time an incision was then made that was approximately 1 to 2 cm over the area of prominent hardware.  The incision was deepened to the subcutaneous tissues and utilizing sharp and blunt dissection care was taken to identify and retract all vital neurovascular  structures all venous contributories were cauterized necessary.  At this time a deep incision was made into the periosteal level of tissue and the periosteum was reflected medially and  laterally in the K wire was then exposed.  The K wire appeared to be loose and was easily removed from its placement.  The surgical site was flushed with copious amounts normal sterile saline.  The skin was then reapproximated well coapted with 4-0 nylon combination of simple and horizontal mattress type stitching.  The pneumatic ankle tourniquet was deflated to the left side.  Postoperative dressings were then applied to each surgical site consisting of Xeroform followed by 4 x 4 gauze, Kerlix, Ace wrap.  The patient tolerated the procedure and anesthesia well was transferred to recovery room vital signs stable vascular status intact all toes of both feet.  The patient will be discharged home with the appropriate follow-up recommendations and orders as well as medications that were placed to pharmacy.   COMPLICATIONS: None  CONDITION: Good, stable  Caroline More, DPM

## 2020-05-04 NOTE — Anesthesia Preprocedure Evaluation (Addendum)
Anesthesia Evaluation  Patient identified by MRN, date of birth, ID band Patient awake    Reviewed: Allergy & Precautions, H&P , NPO status , Patient's Chart, lab work & pertinent test results  Airway Mallampati: II  TM Distance: >3 FB Neck ROM: full    Dental  (+) Teeth Intact, Caps   Pulmonary asthma , former smoker,    breath sounds clear to auscultation       Cardiovascular hypertension,  Rhythm:regular Rate:Normal     Neuro/Psych negative neurological ROS  negative psych ROS   GI/Hepatic negative GI ROS, Neg liver ROS,   Endo/Other  negative endocrine ROS  Renal/GU      Musculoskeletal   Abdominal   Peds  Hematology negative hematology ROS (+)   Anesthesia Other Findings Past Medical History: No date: Asthma No date: Hemorrhoids No date: Hypertension 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) No date: Seasonal allergies  Past Surgical History: No date: ABDOMINAL HYSTERECTOMY No date: CATARACT EXTRACTION, BILATERAL No date: COLONOSCOPY 02/10/2017: COLONOSCOPY WITH PROPOFOL; N/A     Comment:  Procedure: COLONOSCOPY WITH PROPOFOL;  Surgeon: Lollie Sails, MD;  Location[facility] ENDOSCOPY;  Service:               Endoscopy;  Laterality: N/A; 12/31/2017: LEFT HEART CATH AND CORONARY ANGIOGRAPHY; Left     Comment:  Procedure: LEFT HEART CATH AND CORONARY ANGIOGRAPHY;                Surgeon: Isaias Cowman, MD;  Location: [facility];  Service: Cardiovascular;  Laterality:               Left; No date: NASAL SINUS SURGERY No date: pneumovax  BMI    Body Mass Index: 32.07 kg/m      Reproductive/Obstetrics negative OB ROS                            Anesthesia Physical Anesthesia Plan  ASA: II  Anesthesia Plan: General LMA   Post-op Pain Management:    Induction:   PONV Risk Score and Plan: Dexamethasone, Ondansetron, Midazolam  and Treatment may vary due to age or medical condition  Airway Management Planned:   Additional Equipment:   Intra-op Plan:   Post-operative Plan:   Informed Consent: I have reviewed the patients History and Physical, chart, labs and discussed the procedure including the risks, benefits and alternatives for the proposed anesthesia with the patient or authorized representative who has indicated his/her understanding and acceptance.     Dental Advisory Given  Plan Discussed with: Anesthesiologist, CRNA and Surgeon  Anesthesia Plan Comments:         Anesthesia Quick Evaluation

## 2020-05-05 ENCOUNTER — Encounter: Payer: Self-pay | Admitting: Podiatry

## 2020-08-29 ENCOUNTER — Other Ambulatory Visit: Payer: Self-pay | Admitting: Family Medicine

## 2020-08-29 DIAGNOSIS — N6310 Unspecified lump in the right breast, unspecified quadrant: Secondary | ICD-10-CM

## 2020-09-10 ENCOUNTER — Other Ambulatory Visit: Payer: Self-pay | Admitting: Family Medicine

## 2020-09-10 DIAGNOSIS — N6310 Unspecified lump in the right breast, unspecified quadrant: Secondary | ICD-10-CM

## 2020-09-26 ENCOUNTER — Ambulatory Visit
Admission: RE | Admit: 2020-09-26 | Discharge: 2020-09-26 | Disposition: A | Discharge status: Home / Self Care | Payer: Medicare HMO | Source: Ambulatory Visit | Attending: Family Medicine | Admitting: Family Medicine

## 2020-09-26 ENCOUNTER — Other Ambulatory Visit: Payer: Self-pay

## 2020-09-26 DIAGNOSIS — N6310 Unspecified lump in the right breast, unspecified quadrant: Secondary | ICD-10-CM

## 2020-09-26 DIAGNOSIS — N6312 Unspecified lump in the right breast, upper inner quadrant: Secondary | ICD-10-CM | POA: Insufficient documentation

## 2021-02-19 ENCOUNTER — Other Ambulatory Visit: Payer: Self-pay | Admitting: Physical Medicine and Rehabilitation

## 2021-02-19 ENCOUNTER — Other Ambulatory Visit (HOSPITAL_COMMUNITY): Payer: Self-pay | Admitting: Physical Medicine and Rehabilitation

## 2021-02-19 DIAGNOSIS — M5441 Lumbago with sciatica, right side: Secondary | ICD-10-CM

## 2021-02-19 DIAGNOSIS — M5442 Lumbago with sciatica, left side: Secondary | ICD-10-CM

## 2021-03-01 ENCOUNTER — Ambulatory Visit: Payer: Medicare HMO

## 2021-03-04 ENCOUNTER — Ambulatory Visit: Payer: Medicare HMO

## 2021-03-18 ENCOUNTER — Ambulatory Visit: Payer: Medicare HMO

## 2021-04-09 ENCOUNTER — Other Ambulatory Visit: Payer: Self-pay

## 2021-04-09 ENCOUNTER — Ambulatory Visit
Admission: RE | Admit: 2021-04-09 | Discharge: 2021-04-09 | Disposition: A | Discharge status: Home / Self Care | Payer: Medicare HMO | Source: Ambulatory Visit | Attending: Physical Medicine and Rehabilitation | Admitting: Physical Medicine and Rehabilitation

## 2021-04-09 DIAGNOSIS — M5441 Lumbago with sciatica, right side: Secondary | ICD-10-CM | POA: Diagnosis present

## 2021-04-09 DIAGNOSIS — M5442 Lumbago with sciatica, left side: Secondary | ICD-10-CM | POA: Diagnosis not present

## 2021-11-18 ENCOUNTER — Other Ambulatory Visit: Payer: Self-pay | Admitting: Family Medicine

## 2021-11-18 DIAGNOSIS — Z1231 Encounter for screening mammogram for malignant neoplasm of breast: Secondary | ICD-10-CM

## 2021-12-19 ENCOUNTER — Other Ambulatory Visit: Payer: Self-pay

## 2021-12-19 ENCOUNTER — Ambulatory Visit
Admission: RE | Admit: 2021-12-19 | Discharge: 2021-12-19 | Disposition: A | Discharge status: Home / Self Care | Payer: Medicare HMO | Source: Ambulatory Visit | Attending: Family Medicine | Admitting: Family Medicine

## 2021-12-19 DIAGNOSIS — Z1231 Encounter for screening mammogram for malignant neoplasm of breast: Secondary | ICD-10-CM | POA: Diagnosis not present

## 2022-05-13 ENCOUNTER — Emergency Department: Payer: Worker's Compensation

## 2022-05-13 ENCOUNTER — Emergency Department
Admission: EM | Admit: 2022-05-13 | Discharge: 2022-05-13 | Disposition: A | Discharge status: Home / Self Care | Payer: Worker's Compensation | Attending: Emergency Medicine | Admitting: Emergency Medicine

## 2022-05-13 DIAGNOSIS — S7012XA Contusion of left thigh, initial encounter: Secondary | ICD-10-CM | POA: Diagnosis not present

## 2022-05-13 DIAGNOSIS — W1830XA Fall on same level, unspecified, initial encounter: Secondary | ICD-10-CM | POA: Diagnosis not present

## 2022-05-13 DIAGNOSIS — S7002XA Contusion of left hip, initial encounter: Secondary | ICD-10-CM | POA: Insufficient documentation

## 2022-05-13 DIAGNOSIS — Y9389 Activity, other specified: Secondary | ICD-10-CM | POA: Insufficient documentation

## 2022-05-13 DIAGNOSIS — J45909 Unspecified asthma, uncomplicated: Secondary | ICD-10-CM | POA: Diagnosis not present

## 2022-05-13 DIAGNOSIS — I10 Essential (primary) hypertension: Secondary | ICD-10-CM | POA: Insufficient documentation

## 2022-05-13 DIAGNOSIS — S79912A Unspecified injury of left hip, initial encounter: Secondary | ICD-10-CM | POA: Diagnosis present

## 2022-05-13 LAB — CBC WITH DIFFERENTIAL/PLATELET
Abs Immature Granulocytes: 0.01 10*3/uL (ref 0.00–0.07)
Basophils Absolute: 0.1 10*3/uL (ref 0.0–0.1)
Basophils Relative: 1 %
Eosinophils Absolute: 0.8 10*3/uL — ABNORMAL HIGH (ref 0.0–0.5)
Eosinophils Relative: 12 %
HCT: 37 % (ref 36.0–46.0)
Hemoglobin: 11.4 g/dL — ABNORMAL LOW (ref 12.0–15.0)
Immature Granulocytes: 0 %
Lymphocytes Relative: 35 %
Lymphs Abs: 2.3 10*3/uL (ref 0.7–4.0)
MCH: 27.5 pg (ref 26.0–34.0)
MCHC: 30.8 g/dL (ref 30.0–36.0)
MCV: 89.4 fL (ref 80.0–100.0)
Monocytes Absolute: 0.4 10*3/uL (ref 0.1–1.0)
Monocytes Relative: 6 %
Neutro Abs: 3.1 10*3/uL (ref 1.7–7.7)
Neutrophils Relative %: 46 %
Platelets: 307 10*3/uL (ref 150–400)
RBC: 4.14 MIL/uL (ref 3.87–5.11)
RDW: 13 % (ref 11.5–15.5)
WBC: 6.7 10*3/uL (ref 4.0–10.5)
nRBC: 0 % (ref 0.0–0.2)

## 2022-05-13 LAB — BASIC METABOLIC PANEL
Anion gap: 10 (ref 5–15)
BUN: 19 mg/dL (ref 8–23)
CO2: 26 mmol/L (ref 22–32)
Calcium: 9.8 mg/dL (ref 8.9–10.3)
Chloride: 103 mmol/L (ref 98–111)
Creatinine, Ser: 0.91 mg/dL (ref 0.44–1.00)
GFR, Estimated: >60 mL/min (ref >60)
Glucose, Bld: 91 mg/dL (ref 70–99)
Potassium: 3.6 mmol/L (ref 3.5–5.1)
Sodium: 139 mmol/L (ref 135–145)

## 2022-05-13 LAB — PROTIME-INR
INR: 0.9 (ref 0.8–1.2)
Prothrombin Time: 12.4 seconds (ref 11.4–15.2)

## 2022-05-13 MED ORDER — FENTANYL CITRATE PF 50 MCG/ML IJ SOSY
50.0000 ug | PREFILLED_SYRINGE | Freq: Once | INTRAMUSCULAR | Status: AC
  Administered 2022-05-13: 50 ug via INTRAVENOUS
  Filled 2022-05-13: qty 1

## 2022-05-13 NOTE — ED Provider Notes (Signed)
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 female with a history of hypertension and asthma who was in her usual state of health, when she had a mechanical fall, falling onto her left side.  Has pain at the left hip and buttock and thigh.  No head injury, no loss of consciousness.  No neck pain.  No other acute issues.  Initially had pain to bear weight on the left leg.   Physical Exam   Triage Vital Signs: ED Triage Vitals  Enc Vitals Group     BP 05/13/22 1830 (!) 184/64     Pulse Rate 05/13/22 1830 (!) 113     Resp 05/13/22 1830 18     Temp 05/13/22 1830 98.3 F (36.8 C)     Temp Source 05/13/22 1830 Oral     SpO2 05/13/22 1830 99 %     Weight 05/13/22 1835 171 lb (77.6 kg)     Height 05/13/22 1835 '5\' 1"'$  (1.549 m)     Head Circumference --      Peak Flow --      Pain Score 05/13/22 1835 10     Pain Loc --      Pain Edu? --      Excl. in [place]? --     Most recent vital signs: Vitals:   05/13/22 1830 05/13/22 2039  BP: (!) 184/64 (!) 200/65  Pulse: (!) 113 60  Resp: 18 16  Temp: 98.3 F (36.8 C)   SpO2: 99% 100%    General: Awake, no distress.  CV:  Good peripheral perfusion.  Resp:  Normal effort.  Abd:  No distention.  Soft nontender Other:  Pain in the left proximal femur at the hip.  No shortening or limb rotation   ED Results / Procedures / Treatments   Labs (all labs ordered are listed, but only abnormal results are displayed) Labs Reviewed  CBC WITH DIFFERENTIAL/PLATELET - Abnormal; Notable for the following components:      Result Value   Hemoglobin 11.4 (*)    Eosinophils Absolute 0.8 (*)    All other components within normal limits   BASIC METABOLIC PANEL  PROTIME-INR     EKG    RADIOLOGY X-ray left hip and pelvis interpreted by me, negative for acute fracture in the pelvis or femur.  Radiology report reviewed.  Chest x-ray unremarkable   PROCEDURES:  Procedures   MEDICATIONS ORDERED IN ED: Medications  fentaNYL (SUBLIMAZE) injection 50 mcg (50 mcg Intravenous Given 05/13/22 1902)     IMPRESSION / MDM / ASSESSMENT AND PLAN / ED COURSE  I reviewed the triage vital signs and the nursing notes.                              Differential diagnosis includes, but is not limited to, rib fracture, pelvis fracture, contusion, hematoma, hip dislocation  Patient's presentation is most consistent with acute complicated illness / injury requiring diagnostic workup.  Patient presents with left hip pain and difficulty bearing weight after a mechanical fall.  Will obtain x-rays.   Clinical  Course as of 05/13/22 2047  Tue May 13, 2022  1927 Hip x-ray interpreted by me, negative for fracture.  We will ambulate. [PS]    Clinical Course User Index [PS] Carrie Mew, MD     ----------------------------------------- 8:47 PM on 05/13/2022 ----------------------------------------- Tolerating ambulation, pain well controlled.  Low suspicion for occult fracture.  No dislocation.  Stable for discharge, continued supportive care.  FINAL CLINICAL IMPRESSION(S) / ED DIAGNOSES   Final diagnoses:  Contusion of left hip and thigh, initial encounter     Rx / DC Orders   ED Discharge Orders     None        Note:  This document was prepared using Dragon voice recognition software and may include unintentional dictation errors.   Carrie Mew, MD 05/13/22 2047

## 2022-05-13 NOTE — Discharge Instructions (Signed)
Your x-rays are okay today, and your evaluation does not reveal any other acute issues.  Take Tylenol as needed for pain, and apply ice to the area tonight and tomorrow.  Follow-up with your doctor for continued monitoring of your symptoms.

## 2022-05-13 NOTE — ED Notes (Signed)
Pt A&O, IV removed, pt given discharge instructions, pt ambulating with steady gait. 

## 2022-05-30 ENCOUNTER — Encounter: Payer: Self-pay | Admitting: *Deleted

## 2022-06-02 ENCOUNTER — Encounter
Admission: RE | Disposition: A | Discharge status: Home / Self Care | Payer: Self-pay | Source: Home / Self Care | Attending: Gastroenterology

## 2022-06-02 ENCOUNTER — Ambulatory Visit
Admission: RE | Admit: 2022-06-02 | Discharge: 2022-06-02 | Disposition: A | Discharge status: Home / Self Care | Payer: Medicare HMO | Attending: Gastroenterology | Admitting: Gastroenterology

## 2022-06-02 ENCOUNTER — Ambulatory Visit: Payer: Medicare HMO | Admitting: Anesthesiology

## 2022-06-02 ENCOUNTER — Encounter: Payer: Self-pay | Admitting: Anesthesiology

## 2022-06-02 DIAGNOSIS — K640 First degree hemorrhoids: Secondary | ICD-10-CM | POA: Diagnosis not present

## 2022-06-02 DIAGNOSIS — D123 Benign neoplasm of transverse colon: Secondary | ICD-10-CM | POA: Diagnosis not present

## 2022-06-02 DIAGNOSIS — Z85040 Personal history of malignant carcinoid tumor of rectum: Secondary | ICD-10-CM | POA: Insufficient documentation

## 2022-06-02 DIAGNOSIS — I10 Essential (primary) hypertension: Secondary | ICD-10-CM | POA: Diagnosis not present

## 2022-06-02 DIAGNOSIS — Z87891 Personal history of nicotine dependence: Secondary | ICD-10-CM | POA: Diagnosis not present

## 2022-06-02 DIAGNOSIS — D122 Benign neoplasm of ascending colon: Secondary | ICD-10-CM | POA: Diagnosis not present

## 2022-06-02 DIAGNOSIS — K5730 Diverticulosis of large intestine without perforation or abscess without bleeding: Secondary | ICD-10-CM | POA: Insufficient documentation

## 2022-06-02 DIAGNOSIS — J45909 Unspecified asthma, uncomplicated: Secondary | ICD-10-CM | POA: Diagnosis not present

## 2022-06-02 DIAGNOSIS — Z86010 Personal history of colonic polyps: Secondary | ICD-10-CM | POA: Insufficient documentation

## 2022-06-02 DIAGNOSIS — Z09 Encounter for follow-up examination after completed treatment for conditions other than malignant neoplasm: Secondary | ICD-10-CM | POA: Diagnosis present

## 2022-06-02 HISTORY — PX: COLONOSCOPY WITH PROPOFOL: SHX5780

## 2022-06-02 SURGERY — COLONOSCOPY WITH PROPOFOL
Anesthesia: General

## 2022-06-02 MED ORDER — PROPOFOL 1000 MG/100ML IV EMUL
INTRAVENOUS | Status: AC
  Filled 2022-06-02: qty 100

## 2022-06-02 MED ORDER — SODIUM CHLORIDE 0.9 % IV SOLN: INTRAVENOUS | Status: DC

## 2022-06-02 MED ORDER — PROPOFOL 500 MG/50ML IV EMUL
INTRAVENOUS | Status: DC | PRN
  Administered 2022-06-02: 150 ug/kg/min via INTRAVENOUS

## 2022-06-02 NOTE — Interval H&P Note (Signed)
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  The various methods of treatment have been discussed with the patient and family. After consideration of risks, benefits and other options for treatment, the patient has consented to  Procedure(s): COLONOSCOPY WITH PROPOFOL (N/A) as a surgical intervention.  The patient's history has been reviewed, patient examined, no change in status, stable for surgery.  I have reviewed the patient's chart and labs.  Questions were answered to the patient's satisfaction.     Lesly Rubenstein  Ok to proceed with colonoscopy

## 2022-06-02 NOTE — Anesthesia Preprocedure Evaluation (Signed)
Anesthesia Evaluation  Patient identified by MRN, date of birth, ID band Patient awake    Reviewed: Allergy & Precautions, NPO status , Patient's Chart, lab work & pertinent test results  History of Anesthesia Complications Negative for: history of anesthetic complications  Airway Mallampati: III  TM Distance: <3 FB Neck ROM: full    Dental  (+) Chipped   Pulmonary neg shortness of breath, asthma , former smoker,    Pulmonary exam normal        Cardiovascular Exercise Tolerance: Good hypertension, (-) anginaNormal cardiovascular exam     Neuro/Psych negative neurological ROS  negative psych ROS   GI/Hepatic negative GI ROS, Neg liver ROS, neg GERD  ,  Endo/Other  negative endocrine ROS  Renal/GU negative Renal ROS  negative genitourinary   Musculoskeletal   Abdominal   Peds  Hematology negative hematology ROS (+)   Anesthesia Other Findings Past Medical History: No date: Asthma No date: Hemorrhoids No date: Hypertension 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) No date: Seasonal allergies  Past Surgical History: No date: ABDOMINAL HYSTERECTOMY No date: CATARACT EXTRACTION, BILATERAL No date: COLONOSCOPY 02/10/2017: COLONOSCOPY WITH PROPOFOL; N/A     Comment:  Procedure: COLONOSCOPY WITH PROPOFOL;  Surgeon: Lollie Sails, MD;  Location: [facility] ENDOSCOPY;  Service:               Endoscopy;  Laterality: N/A; 12/31/2017: LEFT HEART CATH AND CORONARY ANGIOGRAPHY; Left     Comment:  Procedure: LEFT HEART CATH AND CORONARY ANGIOGRAPHY;                Surgeon: Isaias Cowman, MD;  Location: [place] [facility];  Service: Cardiovascular;  Laterality:               Left; 05/04/2020: MINOR HARDWARE REMOVAL; Bilateral     Comment:  Procedure: HARDWARE REMOVAL BILATERAL FEET;  Surgeon:               Caroline More, DPM;  Location: [facility] ORS;  Service:               Podiatry;   Laterality: Bilateral; No date: NASAL SINUS SURGERY No date: pneumovax  BMI    Body Mass Index: 31.93 kg/m      Reproductive/Obstetrics negative OB ROS                             Anesthesia Physical Anesthesia Plan  ASA: 3  Anesthesia Plan: General   Post-op Pain Management:    Induction: Intravenous  PONV Risk Score and Plan: Propofol infusion and TIVA  Airway Management Planned: Natural Airway and Nasal Cannula  Additional Equipment:   Intra-op Plan:   Post-operative Plan:   Informed Consent: I have reviewed the patients History and Physical, chart, labs and discussed the procedure including the risks, benefits and alternatives for the proposed anesthesia with the patient or authorized representative who has indicated his/her understanding and acceptance.     Dental Advisory Given  Plan Discussed with: Anesthesiologist, CRNA and Surgeon  Anesthesia Plan Comments: (Patient consented for risks of anesthesia including but not limited to:  - adverse reactions to medications - risk of airway placement if required - damage to eyes, teeth, lips or other oral mucosa - nerve damage due to positioning  - sore throat or  hoarseness - Damage to heart, brain, nerves, lungs, other parts of body or loss of life  Patient voiced understanding.)        Anesthesia Quick Evaluation

## 2022-06-02 NOTE — H&P (Signed)
Outpatient short stay form Pre-procedure 06/02/2022  Lesly Rubenstein, MD  Primary Physician: [place] Pink, MD  Reason for visit:  Surveillance colonoscopy  History of present illness:    76 y/o lady with history of hypertension and rectal carcinoid in 2004 with last colonoscopy in 2018 with small TA. No blood thinners. No family history of GI malignancies. No significant abdominal surgeries.    Current Facility-Administered Medications:    0.9 %  sodium chloride infusion, , Intravenous, Continuous, Zacherie Honeyman, Hilton Cork, MD, Last Rate: 20 mL/hr at 06/02/22 0947, New Bag at 06/02/22 0947  Medications Prior to Admission  Medication Sig Dispense Refill Last Dose   amLODipine (NORVASC) 5 MG tablet Take 5 mg by mouth daily.   06/02/2022   lisinopril-hydrochlorothiazide (PRINZIDE,ZESTORETIC) 20-25 MG tablet Take 1 tablet by mouth daily.   06/02/2022   albuterol (PROVENTIL HFA;VENTOLIN HFA) 108 (90 Base) MCG/ACT inhaler Inhale 1-2 puffs into the lungs every 6 (six) hours as needed for wheezing or shortness of breath.       aspirin EC 81 MG tablet Take 81 mg by mouth daily.      cholecalciferol (VITAMIN D3) 25 MCG (1000 UNIT) tablet Take 1,000 Units by mouth daily.      folic acid (FOLVITE) 1 MG tablet Take 1 mg by mouth daily.      Multiple Vitamin (MULTIVITAMIN) capsule Take 1 capsule by mouth daily.      Omega-3 1000 MG CAPS Take 1,000 mg by mouth daily.        No Known Allergies   Past Medical History:  Diagnosis Date   Asthma    Hemorrhoids    Hypertension    Inflammatory polyarthropathy (HCC)    Seasonal allergies     Review of systems:  Otherwise negative.    Physical Exam  Gen: Alert, oriented. Appears stated age.  HEENT: PERRLA. Lungs: No respiratory distress CV: RRR Abd: soft, benign, no masses Ext: No edema    Planned procedures: Proceed with colonoscopy. The patient understands the nature of the planned procedure, indications, risks, alternatives and  potential complications including but not limited to bleeding, infection, perforation, damage to internal organs and possible oversedation/side effects from anesthesia. The patient agrees and gives consent to proceed.  Please refer to procedure notes for findings, recommendations and patient disposition/instructions.     Lesly Rubenstein, MD [facility]

## 2022-06-02 NOTE — Transfer of Care (Signed)
Immediate Anesthesia Transfer of Care Note  Patient: Angela Mccall  Procedure(s) Performed: COLONOSCOPY WITH PROPOFOL  Patient Location: PACU  Anesthesia Type:General  Level of Consciousness: awake and sedated  Airway & Oxygen Therapy: Patient Spontanous Breathing and Patient connected to nasal cannula oxygen  Post-op Assessment: Report given to RN and Post -op Vital signs reviewed and stable  Post vital signs: Reviewed and stable  Last Vitals:  Vitals Value Taken Time  BP    Temp    Pulse    Resp    SpO2      Last Pain:  Vitals:   06/02/22 0928  TempSrc: Temporal  PainSc: 0-No pain         Complications: No notable events documented.

## 2022-06-02 NOTE — Anesthesia Postprocedure Evaluation (Signed)
Anesthesia Post Note  Patient: Angela Mccall  Procedure(s) Performed: COLONOSCOPY WITH PROPOFOL  Patient location during evaluation: Endoscopy Anesthesia Type: General Level of consciousness: awake and alert Pain management: pain level controlled Vital Signs Assessment: post-procedure vital signs reviewed and stable Respiratory status: spontaneous breathing, nonlabored ventilation, respiratory function stable and patient connected to nasal cannula oxygen Cardiovascular status: blood pressure returned to baseline and stable Postop Assessment: no apparent nausea or vomiting Anesthetic complications: no   No notable events documented.   Last Vitals:  Vitals:   06/02/22 1055 06/02/22 1105  BP: (!) 126/44 (!) 143/66  Pulse: 63 60  Resp: 14 16  Temp:    SpO2: 100% 100%    Last Pain:  Vitals:   06/02/22 1105  TempSrc:   PainSc: 0-No pain                 Precious Haws Natalina Wieting

## 2022-06-02 NOTE — Anesthesia Procedure Notes (Signed)
Date/Time: 06/02/2022 10:25 AM  Performed by: Donalda Ewings, CindyPre-anesthesia Checklist: Patient identified, Emergency Drugs available, Suction available, Patient being monitored and Timeout performed Patient Re-evaluated:Patient Re-evaluated prior to induction Oxygen Delivery Method: Nasal cannula Preoxygenation: Pre-oxygenation with 100% oxygen Induction Type: IV induction Placement Confirmation: positive ETCO2 and CO2 detector

## 2022-06-02 NOTE — Op Note (Signed)
[facility] Patient Name: Angela Mccall Procedure Date: 06/02/2022 10:18 AM MRN: [id number] Account #: [id number] Date of Birth: [birth year] Admit Type: Outpatient Age: 76 Room: [facility] ENDO ROOM 3 Gender: Female Note Status: Finalized Instrument Name: Jasper Riling [id number] Procedure:             Colonoscopy Indications:           Surveillance: Personal history of adenomatous polyps                         on last colonoscopy 5 years ago, Incidental - History                         of malignant carcinoid tumor of the rectum Providers:             Andrey Farmer MD, MD Referring MD:          Irven Easterly. Kary Kos, MD (Referring MD) Medicines:             Monitored Anesthesia Care Complications:         No immediate complications. Estimated blood loss:                         Minimal. Procedure:             Pre-Anesthesia Assessment:                        - Prior to the procedure, a History and Physical was                         performed, and patient medications and allergies were                         reviewed. The patient is competent. The risks and                         benefits of the procedure and the sedation options and                         risks were discussed with the patient. All questions                         were answered and informed consent was obtained.                         Patient identification and proposed procedure were                         verified by the physician, the nurse, the                         anesthesiologist, the anesthetist and the technician                         in the endoscopy suite. Mental Status Examination:                         alert and oriented. Airway Examination: normal  oropharyngeal airway and neck mobility. Respiratory                         Examination: clear to auscultation. CV Examination:                         normal. Prophylactic Antibiotics: The  patient does not                         require prophylactic antibiotics. Prior                         Anticoagulants: The patient has taken no previous                         anticoagulant or antiplatelet agents. ASA Grade                         Assessment: III - A patient with severe systemic                         disease. After reviewing the risks and benefits, the                         patient was deemed in satisfactory condition to                         undergo the procedure. The anesthesia plan was to use                         monitored anesthesia care (MAC). Immediately prior to                         administration of medications, the patient was                         re-assessed for adequacy to receive sedatives. The                         heart rate, respiratory rate, oxygen saturations,                         blood pressure, adequacy of pulmonary ventilation, and                         response to care were monitored throughout the                         procedure. The physical status of the patient was                         re-assessed after the procedure.                        After obtaining informed consent, the colonoscope was                         passed under direct vision. Throughout the procedure,  the patient's blood pressure, pulse, and oxygen                         saturations were monitored continuously. The                         Colonoscope was introduced through the anus and                         advanced to the the cecum, identified by appendiceal                         orifice and ileocecal valve. The colonoscopy was                         performed without difficulty. The patient tolerated                         the procedure well. The quality of the bowel                         preparation was good. Findings:      The perianal and digital rectal examinations were normal.      A 2 mm polyp was found in  the ascending colon. The polyp was sessile.       The polyp was removed with a cold snare. Resection and retrieval were       complete. Estimated blood loss was minimal.      A 3 mm polyp was found in the proximal transverse colon. The polyp was       sessile. The polyp was removed with a cold snare. Resection was       complete, but the polyp tissue was not retrieved. Estimated blood loss       was minimal.      A few small-mouthed diverticula were found in the sigmoid colon.      Internal hemorrhoids were found during retroflexion. The hemorrhoids       were Grade I (internal hemorrhoids that do not prolapse).      The exam was otherwise without abnormality on direct and retroflexion       views. Impression:            - One 2 mm polyp in the ascending colon, removed with                         a cold snare. Resected and retrieved.                        - One 3 mm polyp in the proximal transverse colon,                         removed with a cold snare. Complete resection. Polyp                         tissue not retrieved.                        - Diverticulosis in the sigmoid colon.                        -  Internal hemorrhoids.                        - The examination was otherwise normal on direct and                         retroflexion views. Recommendation:        - Discharge patient to home.                        - Resume previous diet.                        - Continue present medications.                        - Await pathology results.                        - Repeat colonoscopy is not recommended due to current                         age (51 years or older) for surveillance.                        - Return to referring physician as previously                         scheduled. Procedure Code(s):     --- Professional ---                        [phone], Colonoscopy, flexible; with removal of                         tumor(s), polyp(s), or other lesion(s) by snare                          technique Diagnosis Code(s):     --- Professional ---                        Z86.010, Personal history of colonic polyps                        K63.5, Polyp of colon                        K64.0, First degree hemorrhoids                        K57.30, Diverticulosis of large intestine without                         perforation or abscess without bleeding CPT copyright 2019 American Medical Association. All rights reserved. The codes documented in this report are preliminary and upon coder review may  be revised to meet current compliance requirements. Andrey Farmer MD, MD 06/02/2022 10:47:24 AM Number of Addenda: 0 Note Initiated On: 06/02/2022 10:18 AM Scope Withdrawal Time: 0 hours 7 minutes 9 seconds  Total Procedure Duration: 0 hours 12 minutes 27 seconds  Estimated Blood Loss:  Estimated blood loss was minimal.      [place]  [facility]

## 2022-06-03 ENCOUNTER — Encounter: Payer: Self-pay | Admitting: Gastroenterology

## 2022-06-03 LAB — SURGICAL PATHOLOGY

## 2024-01-10 IMAGING — MG MM DIGITAL SCREENING BILAT W/ TOMO AND CAD
6 of 10 series · 6 of 30 positions shown · non-contrast
Comparison: Previous exam(s).

CLINICAL DATA: Screening.

EXAM:
DIGITAL SCREENING BILATERAL MAMMOGRAM WITH TOMOSYNTHESIS AND CAD
TECHNIQUE: Bilateral screening digital craniocaudal and mediolateral oblique
mammograms were obtained. Bilateral screening digital breast
tomosynthesis was performed. The images were evaluated with
computer-aided detection.

[R CC synth-2D]
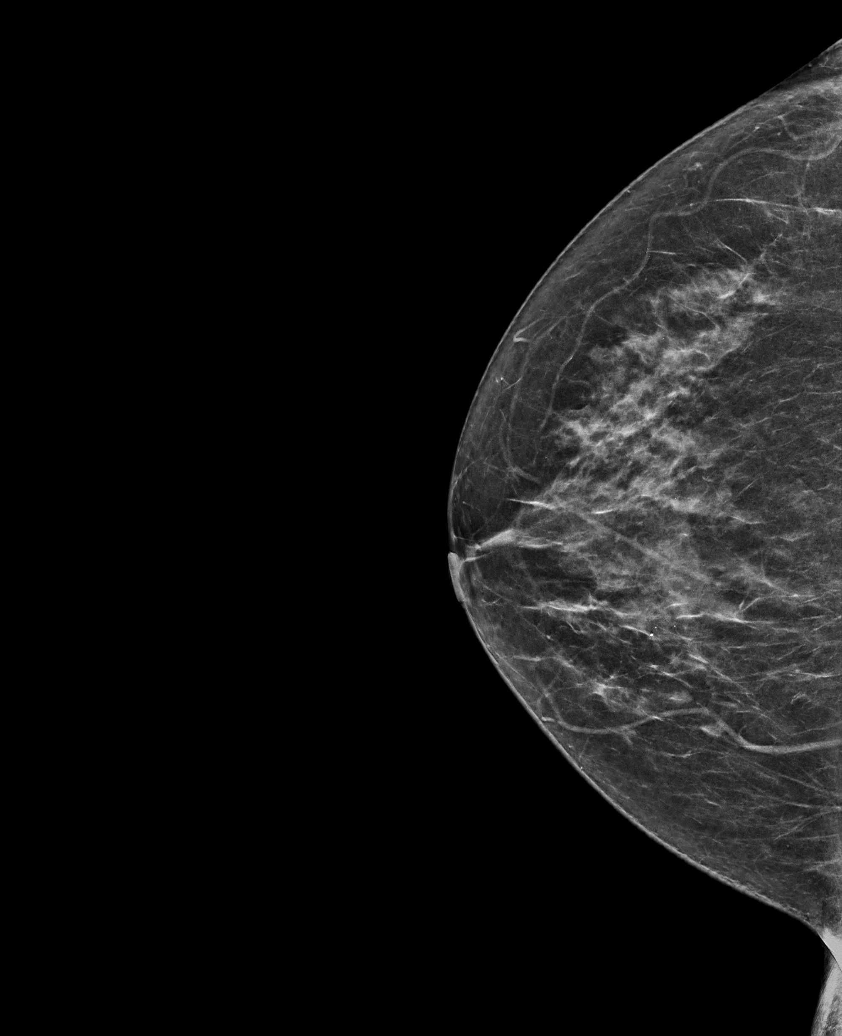

[L MLO synth-2D (1 of 2)]
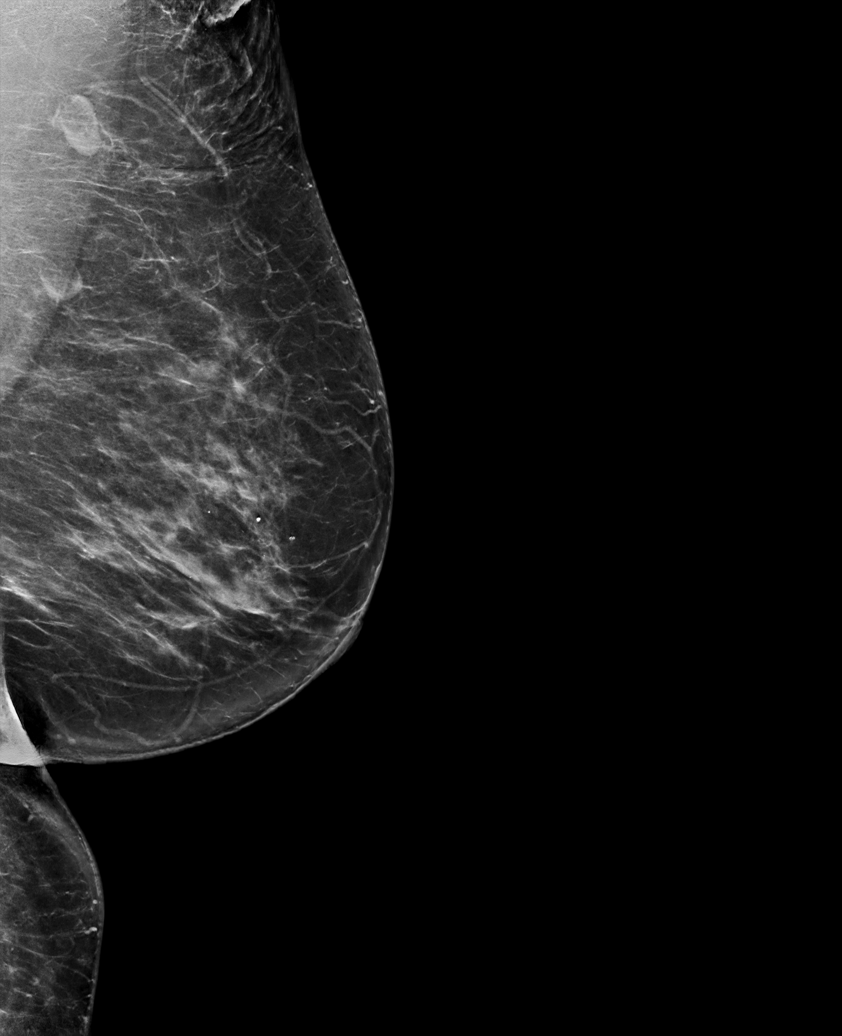

[L CC synth-2D]
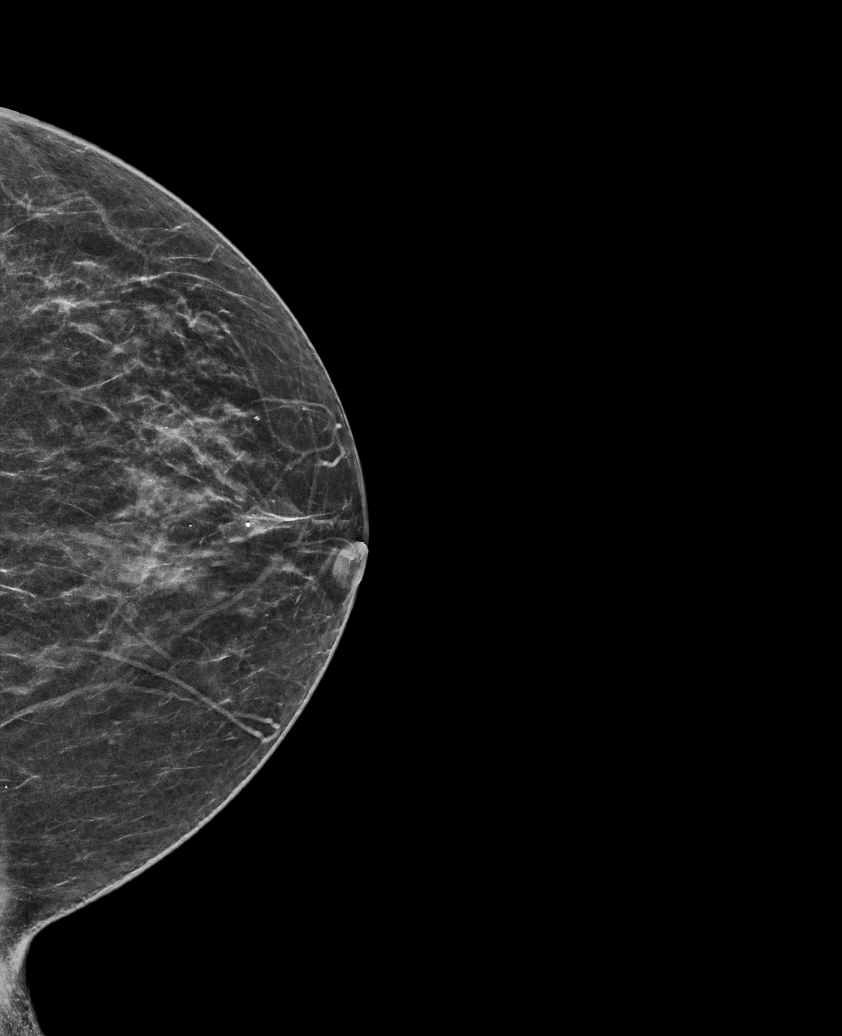

[L MLO synth-2D (2 of 2)]
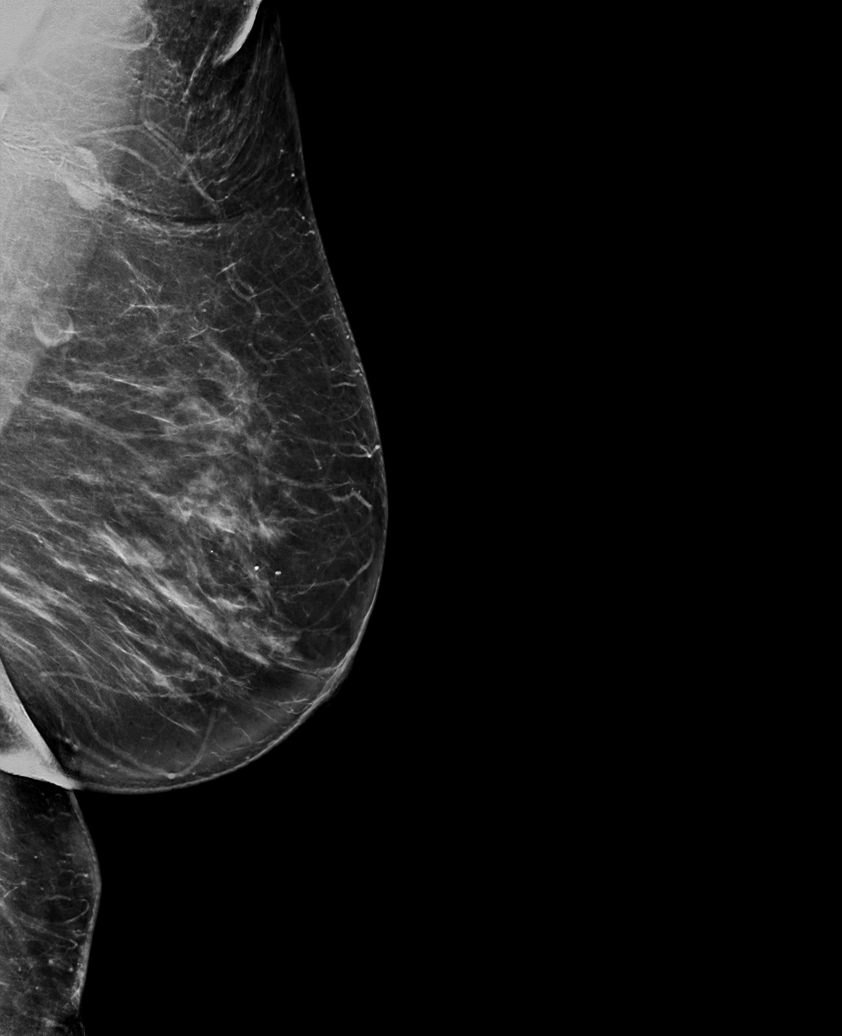

[R MLO synth-2D]
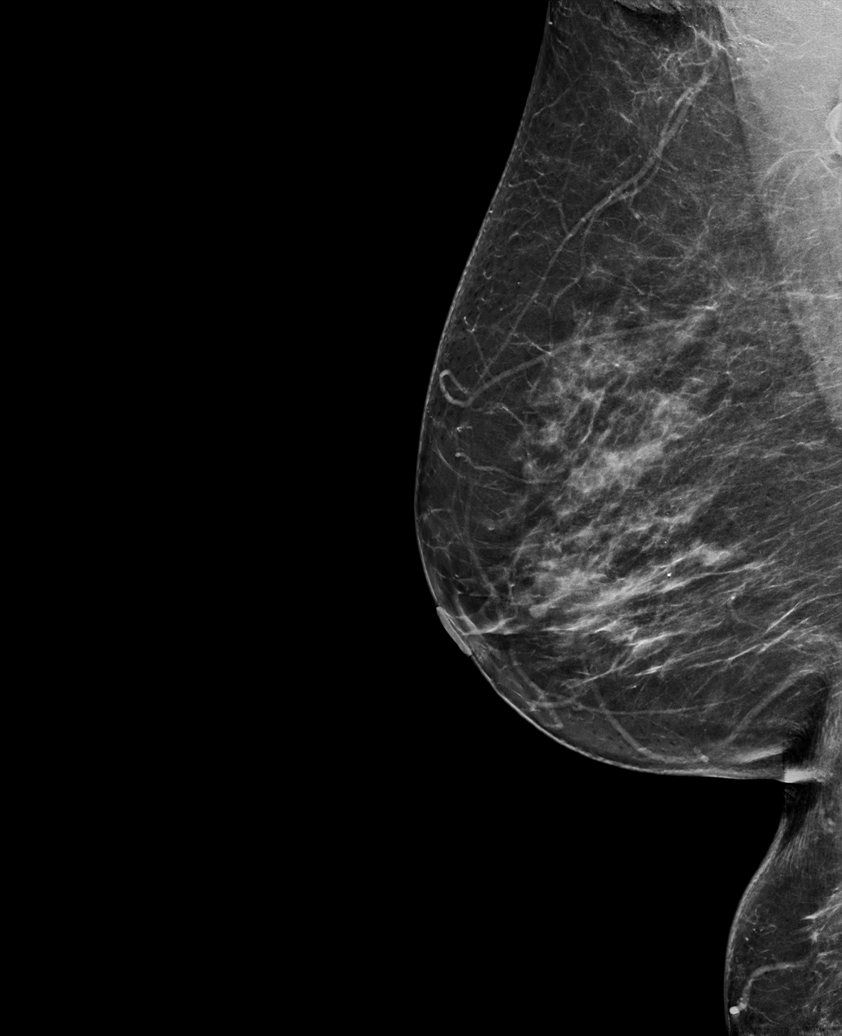

[L CC tomo · tomo slice 31/61.0]
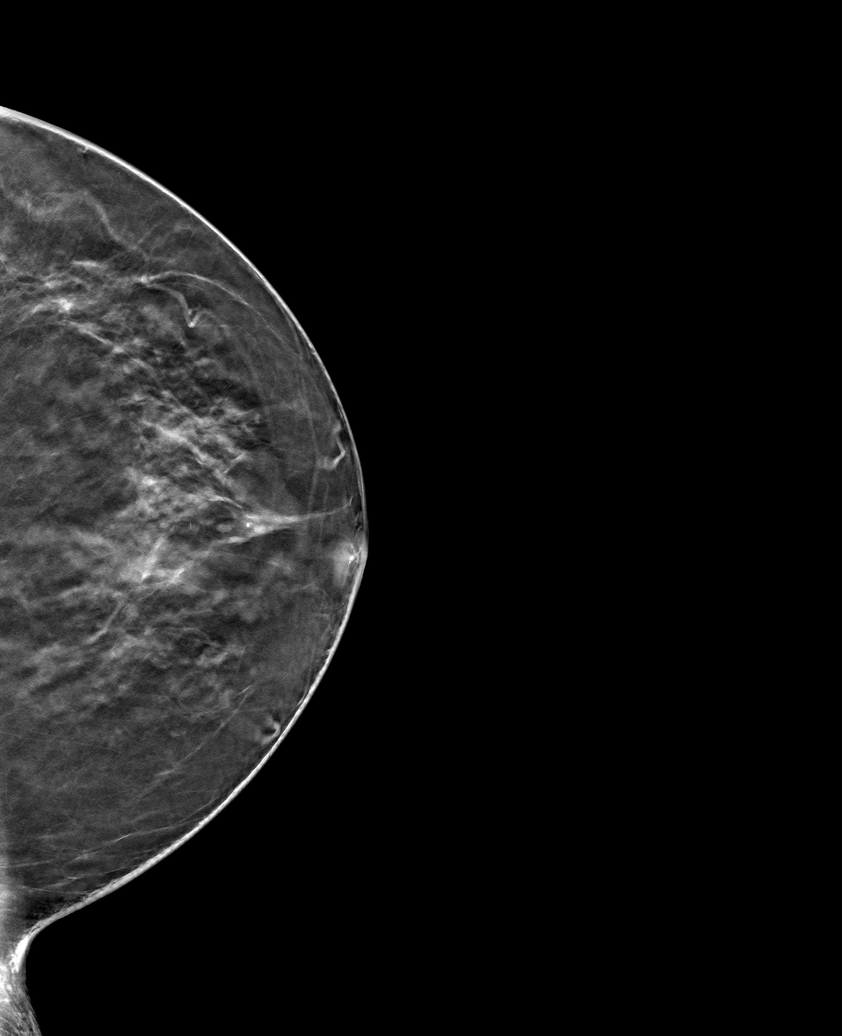

[6 of 30 positions shown; findings below may reference images not displayed]

ACR Breast Density Category c: The breast tissue is heterogeneously
dense, which may obscure small masses.
FINDINGS: There are no findings suspicious for malignancy.
IMPRESSION: No mammographic evidence of malignancy. A result letter of this
screening mammogram will be mailed directly to the patient.

RECOMMENDATION:
Screening mammogram in one year. (Code:Q3-W-BC3)

BI-RADS CATEGORY  1: Negative.

## 2024-03-17 ENCOUNTER — Other Ambulatory Visit: Payer: Self-pay | Admitting: Family Medicine

## 2024-03-17 DIAGNOSIS — Z1231 Encounter for screening mammogram for malignant neoplasm of breast: Secondary | ICD-10-CM

## 2024-03-31 ENCOUNTER — Encounter

## 2024-04-14 ENCOUNTER — Ambulatory Visit
Admission: RE | Admit: 2024-04-14 | Discharge: 2024-04-14 | Disposition: A | Discharge status: Home / Self Care | Payer: Self-pay | Source: Ambulatory Visit | Attending: Family Medicine | Admitting: Family Medicine

## 2024-04-14 DIAGNOSIS — Z1231 Encounter for screening mammogram for malignant neoplasm of breast: Secondary | ICD-10-CM | POA: Diagnosis present

## 2024-04-19 ENCOUNTER — Other Ambulatory Visit: Payer: Self-pay | Admitting: Family Medicine

## 2024-04-19 DIAGNOSIS — R928 Other abnormal and inconclusive findings on diagnostic imaging of breast: Secondary | ICD-10-CM

## 2024-04-25 ENCOUNTER — Ambulatory Visit
Admission: RE | Admit: 2024-04-25 | Discharge: 2024-04-25 | Disposition: A | Discharge status: Home / Self Care | Source: Ambulatory Visit | Attending: Family Medicine | Admitting: Family Medicine

## 2024-04-25 DIAGNOSIS — R928 Other abnormal and inconclusive findings on diagnostic imaging of breast: Secondary | ICD-10-CM | POA: Insufficient documentation

## 2024-04-28 ENCOUNTER — Ambulatory Visit
Admission: RE | Admit: 2024-04-28 | Discharge: 2024-04-28 | Disposition: A | Discharge status: Home / Self Care | Source: Ambulatory Visit | Attending: Family Medicine | Admitting: Family Medicine

## 2024-04-28 DIAGNOSIS — R928 Other abnormal and inconclusive findings on diagnostic imaging of breast: Secondary | ICD-10-CM | POA: Insufficient documentation

## 2024-04-28 HISTORY — PX: BREAST BIOPSY: SHX20

## 2024-04-28 MED ORDER — LIDOCAINE 1 % OPTIME INJ - NO CHARGE
2.0000 mL | Freq: Once | INTRAMUSCULAR | Status: AC
  Administered 2024-04-28: 2 mL
  Filled 2024-04-28: qty 2

## 2024-04-28 MED ORDER — LIDOCAINE-EPINEPHRINE 1 %-1:100000 IJ SOLN
8.0000 mL | Freq: Once | INTRAMUSCULAR | Status: AC
  Administered 2024-04-28: 8 mL
  Filled 2024-04-28: qty 8

## 2024-05-02 LAB — SURGICAL PATHOLOGY

## 2024-05-03 ENCOUNTER — Encounter: Payer: Self-pay | Admitting: *Deleted

## 2024-05-03 NOTE — Progress Notes (Signed)
Referral recieved from [facility] for benign breast mass.   Left VM asking for return call to discuss surgeons.

## 2024-05-03 NOTE — Progress Notes (Signed)
 Ms. Osuch will see Dr. Tye on 7/8 at 8:00.

## 2024-05-10 ENCOUNTER — Ambulatory Visit: Payer: Self-pay | Admitting: Surgery

## 2024-05-10 NOTE — H&P (Signed)
 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 Change was noted on last screening mammogram.   History of Present Illness Noted on screening. Asymptomatic. Menarche at 48, G1P1. First live birth at 12, did breast feed. Hysterectomy in 40s. No HRT   Past Medical History:  has a past medical history of Carcinoid tumor of colon (CMS-HCC), Hemorrhoids, History of chickenpox, History of uterine fibroid, Other and unspecified hyperlipidemia, Seasonal allergies, Synovitis, Tubular adenoma of colon (02/10/2017), Unspecified essential hypertension, and Unspecified inflammatory polyarthropathy (CMS/HHS-HCC).  Past Surgical History:  has a past surgical history that includes Hysterectomy Vaginal (1997); Sinus surgery; Colonoscopy (09/2009); pneumovax (01/2014); Colonoscopy (02/10/2017); and Colon @ [facility] (06/02/2022).  Family History: family history includes Coronary Artery Disease (Blocked arteries around heart) (age of onset: 44) in her sister; High blood pressure (Hypertension) in her brother, father, and mother; Stroke in her brother; Stroke (age of onset: 25) in her sister.  Social History:  reports that she quit smoking about 50 years ago. Her smoking use included cigarettes. She started smoking about 61 years ago. She has a 11 pack-year smoking history. She has never used smokeless tobacco. She reports that she does not drink alcohol and does not use drugs.  Current Medications: has a current medication list which includes the following prescription(s): amlodipine, benzonatate, cholecalciferol, docosahexaenoic acid-epa, fluticasone propionate, furosemide, multivitamin, olmesartan-hydrochlorothiazide , omega-3 acid ethyl esters, tramadol, and albuterol mdi (proventil, ventolin, proair) hfa.  Allergies:  Allergies as of 05/10/2024 - Reviewed 05/10/2024  Allergen Reaction Noted   Cat dander Other (See Comments)  08/03/2014   Cigarette smoke Cough 08/03/2014   Dog dander Other (See Comments) 08/03/2014    ROS:  A 15 point review of systems was performed and was negative except as noted in HPI   Objective:     BP (!) 176/70   Pulse 65   Ht 154.9 cm (5' 1)   Wt 79.8 kg (176 lb)   BMI 33.25 kg/m   Constitutional :  No distress, cooperative, alert  Lymphatics/Throat:  Supple with no lymphadenopathy  Respiratory:  Clear to auscultation bilaterally  Cardiovascular:  Regular rate and rhythm  Gastrointestinal: Soft, non-tender, non-distended, no organomegaly.  Musculoskeletal: Steady gait and movement  Skin: Cool and moist  Psychiatric: Normal affect, non-agitated, not confused  Breast: Normal appearance and no palpable abnormality in right breast and axilla.  Left breast with palpable lump in outer lower quadrant. Steristrips still intact. Chaperone present for exam.      LABS:  Addendum by Anice Norleen JONELLE PONCE, MD on 05/04/2024  1:10 PM EDT  ADDENDUM REPORT: 05/04/2024 11:10   ADDENDUM:  PATHOLOGY revealed: 1. Breast, left, needle core biopsy, 2:00; 4  cmfn - SCLEROSED INTRADUCTAL PAPILLOMA INVOLVING/ASSOCIATED WITH  COMPLEX SCLEROSING LESION - NO MALIGNANCY IDENTIFIED.   Pathology results are CONCORDANT with imaging findings, per Norleen Anice M.D. with consideration of excision recommended.   Pathology results and recommendations were discussed with patient  via telephone on 05/03/2024 by Rock Hover RN. Patient reported biopsy  site doing well with no adverse symptoms, and slight tenderness at  the site. Post biopsy care instructions were reviewed, questions  were answered and my direct phone number was provided. Patient was  instructed to call [facility] for any additional  questions or concerns related to biopsy site.   RECOMMENDATION: Surgical consultation for consideration of excision.  Request for surgical consultation relayed to [facility]  RN at  [facility] by Rock Hover RN on 05/03/2024.   Pathology results reported by Rock Hover RN on 05/04/2024.    Electronically Signed    By: Norleen Croak M.D.    On: 05/04/2024 11:10    RADS: CLINICAL DATA:  Patient was recalled from screening mammogram for  calcifications and possible distortion in the left breast.   EXAM:  DIGITAL DIAGNOSTIC UNILATERAL LEFT MAMMOGRAM WITH TOMOSYNTHESIS AND  CAD; ULTRASOUND LEFT BREAST LIMITED   TECHNIQUE:  Left digital diagnostic mammography and breast tomosynthesis was  performed. The images were evaluated with computer-aided detection.  ; Targeted ultrasound examination of the left breast was performed.   COMPARISON:  Previous exam(s).   ACR Breast Density Category c: The breasts are heterogeneously  dense, which may obscure small masses.   FINDINGS:  Additional imaging of the left breast was performed. There is  persistence of an asymmetry with distortion and coarse heterogeneous  calcifications in the upper-outer quadrant of the breast. The  distortion calcifications span an area 3.2 cm.   Targeted ultrasound is performed, showing an irregular mass with  irregular margins in the left breast at 2 o'clock 4 cm from the  nipple measuring 10 x 4 x 6 mm. Sonographic evaluation the left  axilla does not show any enlarged adenopathy.   IMPRESSION:  Suspicious mass in the 2 o'clock region left breast 4 cm from the  nipple.   RECOMMENDATION:  Ultrasound-guided core biopsy of the mass in the 2 o'clock region of  the left breast is recommended.   I have discussed the findings and recommendations with the patient.  If applicable, a reminder letter will be sent to the patient  regarding the next appointment.   BI-RADS CATEGORY  5: Highly suggestive of malignancy.    Electronically Signed    By: Dina  Arceo M.D.    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 Complex sclerosing  lesion of left breast [N64.89]  Discussed the risk of surgery including recurrence, chronic pain, post-op infxn, poor/delayed wound healing, poor cosmesis, seroma, hematoma formation, and possible re-operation to address said risks. The risks of general anesthetic, if used, includes MI, CVA, sudden death or even reaction to anesthetic medications also discussed.  Typical post-op recovery time and possbility of activity restrictions were also discussed.  Alternatives include continued observation.  Benefits include possible symptom relief, pathologic evaluation, and/or curative excision.   The patient verbalized understanding and all questions were answered to the patient's satisfaction.  2. Patient has elected to proceed with surgical treatment. Procedure will be scheduled.  Left, SCOUT tag, NO SLNB.  labs/images/medications/previous chart entries reviewed personally and relevant changes/updates noted above.

## 2024-05-10 NOTE — H&P (View-Only) (Signed)
 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 Change was noted on last screening mammogram.   History of Present Illness Noted on screening. Asymptomatic. Menarche at 48, G1P1. First live birth at 12, did breast feed. Hysterectomy in 40s. No HRT   Past Medical History:  has a past medical history of Carcinoid tumor of colon (CMS-HCC), Hemorrhoids, History of chickenpox, History of uterine fibroid, Other and unspecified hyperlipidemia, Seasonal allergies, Synovitis, Tubular adenoma of colon (02/10/2017), Unspecified essential hypertension, and Unspecified inflammatory polyarthropathy (CMS/HHS-HCC).  Past Surgical History:  has a past surgical history that includes Hysterectomy Vaginal (1997); Sinus surgery; Colonoscopy (09/2009); pneumovax (01/2014); Colonoscopy (02/10/2017); and Colon @ [facility] (06/02/2022).  Family History: family history includes Coronary Artery Disease (Blocked arteries around heart) (age of onset: 44) in her sister; High blood pressure (Hypertension) in her brother, father, and mother; Stroke in her brother; Stroke (age of onset: 25) in her sister.  Social History:  reports that she quit smoking about 50 years ago. Her smoking use included cigarettes. She started smoking about 61 years ago. She has a 11 pack-year smoking history. She has never used smokeless tobacco. She reports that she does not drink alcohol and does not use drugs.  Current Medications: has a current medication list which includes the following prescription(s): amlodipine, benzonatate, cholecalciferol, docosahexaenoic acid-epa, fluticasone propionate, furosemide, multivitamin, olmesartan-hydrochlorothiazide , omega-3 acid ethyl esters, tramadol, and albuterol mdi (proventil, ventolin, proair) hfa.  Allergies:  Allergies as of 05/10/2024 - Reviewed 05/10/2024  Allergen Reaction Noted   Cat dander Other (See Comments)  08/03/2014   Cigarette smoke Cough 08/03/2014   Dog dander Other (See Comments) 08/03/2014    ROS:  A 15 point review of systems was performed and was negative except as noted in HPI   Objective:     BP (!) 176/70   Pulse 65   Ht 154.9 cm (5' 1)   Wt 79.8 kg (176 lb)   BMI 33.25 kg/m   Constitutional :  No distress, cooperative, alert  Lymphatics/Throat:  Supple with no lymphadenopathy  Respiratory:  Clear to auscultation bilaterally  Cardiovascular:  Regular rate and rhythm  Gastrointestinal: Soft, non-tender, non-distended, no organomegaly.  Musculoskeletal: Steady gait and movement  Skin: Cool and moist  Psychiatric: Normal affect, non-agitated, not confused  Breast: Normal appearance and no palpable abnormality in right breast and axilla.  Left breast with palpable lump in outer lower quadrant. Steristrips still intact. Chaperone present for exam.      LABS:  Addendum by Anice Norleen JONELLE PONCE, MD on 05/04/2024  1:10 PM EDT  ADDENDUM REPORT: 05/04/2024 11:10   ADDENDUM:  PATHOLOGY revealed: 1. Breast, left, needle core biopsy, 2:00; 4  cmfn - SCLEROSED INTRADUCTAL PAPILLOMA INVOLVING/ASSOCIATED WITH  COMPLEX SCLEROSING LESION - NO MALIGNANCY IDENTIFIED.   Pathology results are CONCORDANT with imaging findings, per Norleen Anice M.D. with consideration of excision recommended.   Pathology results and recommendations were discussed with patient  via telephone on 05/03/2024 by Rock Hover RN. Patient reported biopsy  site doing well with no adverse symptoms, and slight tenderness at  the site. Post biopsy care instructions were reviewed, questions  were answered and my direct phone number was provided. Patient was  instructed to call [facility] for any additional  questions or concerns related to biopsy site.   RECOMMENDATION: Surgical consultation for consideration of excision.  Request for surgical consultation relayed to [facility]  RN at  [facility] by Rock Hover RN on 05/03/2024.   Pathology results reported by Rock Hover RN on 05/04/2024.    Electronically Signed    By: Norleen Croak M.D.    On: 05/04/2024 11:10    RADS: CLINICAL DATA:  Patient was recalled from screening mammogram for  calcifications and possible distortion in the left breast.   EXAM:  DIGITAL DIAGNOSTIC UNILATERAL LEFT MAMMOGRAM WITH TOMOSYNTHESIS AND  CAD; ULTRASOUND LEFT BREAST LIMITED   TECHNIQUE:  Left digital diagnostic mammography and breast tomosynthesis was  performed. The images were evaluated with computer-aided detection.  ; Targeted ultrasound examination of the left breast was performed.   COMPARISON:  Previous exam(s).   ACR Breast Density Category c: The breasts are heterogeneously  dense, which may obscure small masses.   FINDINGS:  Additional imaging of the left breast was performed. There is  persistence of an asymmetry with distortion and coarse heterogeneous  calcifications in the upper-outer quadrant of the breast. The  distortion calcifications span an area 3.2 cm.   Targeted ultrasound is performed, showing an irregular mass with  irregular margins in the left breast at 2 o'clock 4 cm from the  nipple measuring 10 x 4 x 6 mm. Sonographic evaluation the left  axilla does not show any enlarged adenopathy.   IMPRESSION:  Suspicious mass in the 2 o'clock region left breast 4 cm from the  nipple.   RECOMMENDATION:  Ultrasound-guided core biopsy of the mass in the 2 o'clock region of  the left breast is recommended.   I have discussed the findings and recommendations with the patient.  If applicable, a reminder letter will be sent to the patient  regarding the next appointment.   BI-RADS CATEGORY  5: Highly suggestive of malignancy.    Electronically Signed    By: Dina  Arceo M.D.    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 Complex sclerosing  lesion of left breast [N64.89]  Discussed the risk of surgery including recurrence, chronic pain, post-op infxn, poor/delayed wound healing, poor cosmesis, seroma, hematoma formation, and possible re-operation to address said risks. The risks of general anesthetic, if used, includes MI, CVA, sudden death or even reaction to anesthetic medications also discussed.  Typical post-op recovery time and possbility of activity restrictions were also discussed.  Alternatives include continued observation.  Benefits include possible symptom relief, pathologic evaluation, and/or curative excision.   The patient verbalized understanding and all questions were answered to the patient's satisfaction.  2. Patient has elected to proceed with surgical treatment. Procedure will be scheduled.  Left, SCOUT tag, NO SLNB.  labs/images/medications/previous chart entries reviewed personally and relevant changes/updates noted above.

## 2024-05-11 ENCOUNTER — Encounter: Payer: Self-pay | Admitting: Surgery

## 2024-05-11 ENCOUNTER — Other Ambulatory Visit: Payer: Self-pay | Admitting: Surgery

## 2024-05-11 DIAGNOSIS — N6489 Other specified disorders of breast: Secondary | ICD-10-CM

## 2024-05-12 ENCOUNTER — Ambulatory Visit
Admission: RE | Admit: 2024-05-12 | Discharge: 2024-05-12 | Disposition: A | Discharge status: Home / Self Care | Source: Ambulatory Visit | Attending: Surgery | Admitting: Surgery

## 2024-05-12 DIAGNOSIS — N6489 Other specified disorders of breast: Secondary | ICD-10-CM | POA: Diagnosis present

## 2024-05-12 HISTORY — PX: BREAST BIOPSY: SHX20

## 2024-05-12 MED ORDER — LIDOCAINE HCL 1 % IJ SOLN
10.0000 mL | Freq: Once | INTRAMUSCULAR | Status: AC
  Administered 2024-05-12: 10 mL

## 2024-05-17 ENCOUNTER — Encounter: Payer: Self-pay | Admitting: Surgery

## 2024-05-18 ENCOUNTER — Encounter
Admission: RE | Admit: 2024-05-18 | Discharge: 2024-05-18 | Disposition: A | Discharge status: Home / Self Care | Source: Ambulatory Visit | Attending: Surgery | Admitting: Surgery

## 2024-05-18 ENCOUNTER — Other Ambulatory Visit: Payer: Self-pay

## 2024-05-18 ENCOUNTER — Telehealth: Payer: Self-pay | Admitting: Urgent Care

## 2024-05-18 ENCOUNTER — Ambulatory Visit: Payer: Self-pay | Admitting: Urgent Care

## 2024-05-18 VITALS — BP 173/77 | HR 70 | Temp 98.2°F | Resp 18 | Ht 60.0 in | Wt 183.0 lb

## 2024-05-18 DIAGNOSIS — E876 Hypokalemia: Secondary | ICD-10-CM | POA: Diagnosis not present

## 2024-05-18 DIAGNOSIS — N6489 Other specified disorders of breast: Secondary | ICD-10-CM

## 2024-05-18 DIAGNOSIS — I10 Essential (primary) hypertension: Secondary | ICD-10-CM | POA: Insufficient documentation

## 2024-05-18 DIAGNOSIS — Z79899 Other long term (current) drug therapy: Secondary | ICD-10-CM

## 2024-05-18 DIAGNOSIS — I491 Atrial premature depolarization: Secondary | ICD-10-CM | POA: Insufficient documentation

## 2024-05-18 DIAGNOSIS — T502X5A Adverse effect of carbonic-anhydrase inhibitors, benzothiadiazides and other diuretics, initial encounter: Secondary | ICD-10-CM | POA: Diagnosis not present

## 2024-05-18 DIAGNOSIS — M79605 Pain in left leg: Secondary | ICD-10-CM | POA: Insufficient documentation

## 2024-05-18 DIAGNOSIS — R001 Bradycardia, unspecified: Secondary | ICD-10-CM | POA: Diagnosis not present

## 2024-05-18 DIAGNOSIS — R600 Localized edema: Secondary | ICD-10-CM | POA: Insufficient documentation

## 2024-05-18 DIAGNOSIS — Z01812 Encounter for preprocedural laboratory examination: Secondary | ICD-10-CM

## 2024-05-18 DIAGNOSIS — M79604 Pain in right leg: Secondary | ICD-10-CM | POA: Diagnosis not present

## 2024-05-18 DIAGNOSIS — Z01818 Encounter for other preprocedural examination: Secondary | ICD-10-CM | POA: Insufficient documentation

## 2024-05-18 HISTORY — DX: Chronic obstructive pulmonary disease, unspecified: J44.9

## 2024-05-18 HISTORY — DX: Benign neoplasm of colon, unspecified: D12.6

## 2024-05-18 HISTORY — DX: Essential (primary) hypertension: I10

## 2024-05-18 HISTORY — DX: Hyperlipidemia, unspecified: E78.5

## 2024-05-18 HISTORY — DX: Unspecified synovitis and tenosynovitis, unspecified site: M65.90

## 2024-05-18 HISTORY — DX: Dyspnea, unspecified: R06.00

## 2024-05-18 HISTORY — DX: Personal history of other benign neoplasm: Z86.018

## 2024-05-18 LAB — BASIC METABOLIC PANEL WITH GFR
Anion gap: 8 (ref 5–15)
BUN: 21 mg/dL (ref 8–23)
CO2: 28 mmol/L (ref 22–32)
Calcium: 9.6 mg/dL (ref 8.9–10.3)
Chloride: 103 mmol/L (ref 98–111)
Creatinine, Ser: 0.99 mg/dL (ref 0.44–1.00)
GFR, Estimated: 59 mL/min — ABNORMAL LOW (ref >60)
Glucose, Bld: 92 mg/dL (ref 70–99)
Potassium: 3.1 mmol/L — ABNORMAL LOW (ref 3.5–5.1)
Sodium: 139 mmol/L (ref 135–145)

## 2024-05-18 LAB — CBC
HCT: 37.6 % (ref 36.0–46.0)
Hemoglobin: 12 g/dL (ref 12.0–15.0)
MCH: 27.8 pg (ref 26.0–34.0)
MCHC: 31.9 g/dL (ref 30.0–36.0)
MCV: 87.2 fL (ref 80.0–100.0)
Platelets: 319 K/uL (ref 150–400)
RBC: 4.31 MIL/uL (ref 3.87–5.11)
RDW: 12.9 % (ref 11.5–15.5)
WBC: 6.2 K/uL (ref 4.0–10.5)
nRBC: 0 % (ref 0.0–0.2)

## 2024-05-18 LAB — MAGNESIUM: Magnesium: 2.2 mg/dL (ref 1.7–2.4)

## 2024-05-18 MED ORDER — POTASSIUM CHLORIDE ER 10 MEQ PO TBCR: EXTENDED_RELEASE_TABLET | ORAL | 0 refills | Status: AC

## 2024-05-18 NOTE — Patient Instructions (Addendum)
 Your procedure is scheduled on: Thursday 05/26/24 Report to the Registration Desk on the 1st floor of the [facility]. To find out your arrival time, please call [phone] between 1PM - 3PM on: Wednesday 05/25/24 If your arrival time is 6:00 am, do not arrive before that time as the Medical Mall entrance doors do not open until 6:00 am.  REMEMBER: Instructions that are not followed completely may result in serious medical risk, up to and including death; or upon the discretion of your surgeon and anesthesiologist your surgery may need to be rescheduled.  Do not eat food after midnight the night before surgery.  No gum chewing or hard candies.  You may however, drink CLEAR liquids up to 2 hours before you are scheduled to arrive for your surgery. Do not drink anything within 2 hours of your scheduled arrival time.  Clear liquids include: - water  - apple juice without pulp - black coffee or tea (Do NOT add milk or creamers to the coffee or tea) Do NOT drink anything that is not on this list.  One week prior to surgery: Stop Anti-inflammatories (NSAIDS) such as Advil, Aleve, Ibuprofen, Motrin, Naproxen, Naprosyn and Aspirin  based products such as Excedrin, Goody's Powder, BC Powder. Stop ANY OVER THE COUNTER supplements until after surgery.  You may however, continue to take Tylenol  if needed for pain up until the day of surgery.   Continue taking all of your other prescription medications up until the day of surgery.  ON THE DAY OF SURGERY ONLY TAKE THESE MEDICATIONS WITH SIPS OF WATER:  amLODipine (NORVASC) 5 MG    Use inhalers on the day of surgery and bring to the hospital. fluticasone (FLONASE) 50 MCG/ACT nasal spray    No Alcohol for 24 hours before or after surgery.  No Smoking including e-cigarettes for 24 hours before surgery.  No chewable tobacco products for at least 6 hours before surgery.  No nicotine patches on the day of surgery.  Do not use any  recreational drugs for at least a week (preferably 2 weeks) before your surgery.  Please be advised that the combination of cocaine and anesthesia may have negative outcomes, up to and including death. If you test positive for cocaine, your surgery will be cancelled.  On the morning of surgery brush your teeth with toothpaste and water, you may rinse your mouth with mouthwash if you wish. Do not swallow any toothpaste or mouthwash.  Use CHG Soap or wipes as directed on instruction sheet.  Do not wear jewelry, make-up, hairpins, clips or nail polish.  For welded (permanent) jewelry: bracelets, anklets, waist bands, etc.  Please have this removed prior to surgery.  If it is not removed, there is a chance that hospital personnel will need to cut it off on the day of surgery.  Do not wear lotions, powders, or perfumes.   Do not shave body hair from the neck down 48 hours before surgery.  Contact lenses, hearing aids and dentures may not be worn into surgery.  Do not bring valuables to the hospital. [facility] is not responsible for any missing/lost belongings or valuables.   Total Shoulder Arthroplasty:  use Benzoyl Peroxide 5% Gel as directed on instruction sheet.  Bring your C-PAP to the hospital in case you may have to spend the night.   Notify your doctor if there is any change in your medical condition (cold, fever, infection).  Wear comfortable clothing (specific to your surgery type) to the hospital.  After surgery, you can help prevent lung complications by doing breathing exercises.  Take deep breaths and cough every 1-2 hours. Your doctor may order a device called an Incentive Spirometer to help you take deep breaths. When coughing or sneezing, hold a pillow firmly against your incision with both hands. This is called "splinting." Doing this helps protect your incision. It also decreases belly discomfort.  If you are being admitted to the hospital overnight, leave your  suitcase in the car. After surgery it may be brought to your room.  In case of increased patient census, it may be necessary for you, the patient, to continue your postoperative care in the Same Day Surgery department.  If you are being discharged the day of surgery, you will not be allowed to drive home. You will need a responsible individual to drive you home and stay with you for 24 hours after surgery.   If you are taking public transportation, you will need to have a responsible individual with you.  Please call the Pre-admissions Testing Dept. at [phone] if you have any questions about these instructions.  Surgery Visitation Policy:  Patients having surgery or a procedure may have two visitors.  Children under the age of 34 must have an adult with them who is not the patient.  Inpatient Visitation:    Visiting hours are 7 a.m. to 8 p.m. Up to four visitors are allowed at one time in a patient room. The visitors may rotate out with other people during the day.  One visitor age 63 or older may stay with the patient overnight and must be in the room by 8 p.m.   Merchandiser, retail to address health-related social needs:  [email or web address]    Preparing for Surgery with CHLORHEXIDINE  GLUCONATE (CHG) Soap  Chlorhexidine  Gluconate (CHG) Soap  o An antiseptic cleaner that kills germs and bonds with the skin to continue killing germs even after washing  o Used for showering the night before surgery and morning of surgery  Before surgery, you can play an important role by reducing the number of germs on your skin.  CHG (Chlorhexidine  gluconate) soap is an antiseptic cleanser which kills germs and bonds with the skin to continue killing germs even after washing.  Please do not use if you have an allergy to CHG or antibacterial soaps. If your skin becomes reddened/irritated stop using the CHG.  1. Shower the NIGHT BEFORE SURGERY and the MORNING OF  SURGERY with CHG soap.  2. If you choose to wash your hair, wash your hair first as usual with your normal shampoo.  3. After shampooing, rinse your hair and body thoroughly to remove the shampoo.  4. Use CHG as you would any other liquid soap. You can apply CHG directly to the skin and wash gently with a scrungie or a clean washcloth.  5. Apply the CHG soap to your body only from the neck down. Do not use on open wounds or open sores. Avoid contact with your eyes, ears, mouth, and genitals (private parts). Wash face and genitals (private parts) with your normal soap.  6. Wash thoroughly, paying special attention to the area where your surgery will be performed.  7. Thoroughly rinse your body with warm water.  8. Do not shower/wash with your normal soap after using and rinsing off the CHG soap.  9. Pat yourself dry with a clean towel.  10. Wear clean pajamas to bed the night before surgery.  12. Place clean sheets  on your bed the night of your first shower and do not sleep with pets.  13. Shower again with the CHG soap on the day of surgery prior to arriving at the hospital.  14. Do not apply any deodorants/lotions/powders.  15. Please wear clean clothes to the hospital.

## 2024-05-18 NOTE — Progress Notes (Signed)
 [facility] Perioperative Services: Pre-Admission/Anesthesia Testing  Abnormal Lab Notification and Treatment Plan of Care   Date: 05/18/24  Name: Angela Mccall DOB: 10-30-1946 MRN:   [id number]  Re: Abnormal labs noted during PAT appointment   Notified:  Provider Name Provider Role Notification Mode  Sakai, Isami, DO  General Surgery (Surgeon) Routed and/or faxed via RANELL Valora Agent, MD Primary Care Provider Routed and/or faxed via [facility]   Clinical Information and Notes:  ABNORMAL LAB VALUE(S): Lab Results  Component Value Date   K 3.1 (L) 05/18/2024   Angela Mccall is scheduled for a LEFT BREAST LUMPECTOMY WITH RADIO FREQUENCY LOCALIZER on 05/26/2024. In review of her medication reconciliation, it is noted that the patient is taking prescribed diuretic medications (HCTZ 25 mg) daily.   Please note, in efforts to promote a safe and effective anesthetic course, per current guidelines/standards set by the [facility] anesthesia team, the minimal acceptable K+ level for the patient to proceed with general anesthesia is 3.0 mmol/L. With that being said, if the patient drops any lower, her elective procedure will need to be postponed until K+ is better optimized. In efforts to prevent case cancellation, and ultimately to promote the safety of this patient undergoing sedation/anesthesia, will make efforts to optimize pre-surgical K+ level allowing the surgical intervention to proceed as planned.    Impression and Plan:  Angela Mccall found to be HYPOkalemic at 3.1 mmol/L on preoperative labs.   Mg level was added and found to be normal at: 2.2 mg/dL  She is on daily thiazide diuretic therapy. Discussed diuretic therapy as likely etiology in the setting of normal Mg level and in the absence of GI related symptoms (no diarrhea). Patient denies regular use of laxative medications. Reviewed other potential causes, including decreased intake of dietary K+ and  warmer weather resulting in increased insensible losses. Reviewed plans for short term preoperative optimization as follows:   Meds ordered this encounter  Medications   potassium chloride  (KLOR-CON ) 10 MEQ tablet    Sig: Take 2 tablets (20 mEq) today, then take 1 tablet (10 mEq) daily until surgery.  Be sure to take a dose on day of surgery.  Follow-up with PCP for repeat labs.    Dispense:  10 tablet    Refill:  0    Please contact the patient as soon as it is available for pickup. Rx is for preoperative K+ optimization and needs to be started ASAP.   Encouraged patient to follow up with PCP about 2-3 weeks postoperatively to have labs rechecked to ensure that levels are remaining within normal range. Discussed nutritional intake of K+ rich foods as an adjunctive way to keep her K+ levels normal; list of K+ rich foods provided. Also mentioned ORS, however advised her not to rely solely on these drinks, as they are high in Na+, and she has a HTN diagnosis.   Will send copy of this note to surgeon and PCP to make them aware of K+ level and plans for correction. Discussed that PCP may elect to pursue a change in diuretic therapy to a K+ sparing type medication, or alternatively, they may consider adding a daily K+ supplement if levels remain low on recheck. Order entered to recheck K+ on the day of her surgery to ensure optimization. Wished patient the best of luck with her upcoming surgery and subsequent recovery. She was encouraged to return call to the PAT clinic, or to her surgeon's office, should any questions  or concerns arise between now and the time of her surgery.   Encounter Diagnoses  Name Primary?   Pre-operative laboratory examination Yes   Long term current use of diuretic    Diuretic-induced hypokalemia    Dorise Pereyra, MSN, APRN, FNP-C, CEN [facility]  Perioperative Services Nurse Practitioner Phone: [phone] 05/18/24 2:44 PM  NOTE: This note has been  prepared using Dragon dictation software. Despite my best ability to proofread, there is always the potential that unintentional transcriptional errors may still occur from this process.

## 2024-05-26 ENCOUNTER — Ambulatory Visit: Admitting: Anesthesiology

## 2024-05-26 ENCOUNTER — Ambulatory Visit: Payer: Self-pay | Admitting: Urgent Care

## 2024-05-26 ENCOUNTER — Encounter: Payer: Self-pay | Admitting: Surgery

## 2024-05-26 ENCOUNTER — Ambulatory Visit

## 2024-05-26 ENCOUNTER — Ambulatory Visit
Admission: RE | Admit: 2024-05-26 | Discharge: 2024-05-26 | Disposition: A | Discharge status: Home / Self Care | Source: Ambulatory Visit | Attending: Surgery | Admitting: Surgery

## 2024-05-26 ENCOUNTER — Encounter
Admission: RE | Disposition: A | Discharge status: Home / Self Care | Payer: Self-pay | Source: Ambulatory Visit | Attending: Surgery

## 2024-05-26 ENCOUNTER — Other Ambulatory Visit: Payer: Self-pay

## 2024-05-26 DIAGNOSIS — Z87891 Personal history of nicotine dependence: Secondary | ICD-10-CM | POA: Diagnosis not present

## 2024-05-26 DIAGNOSIS — J4489 Other specified chronic obstructive pulmonary disease: Secondary | ICD-10-CM | POA: Insufficient documentation

## 2024-05-26 DIAGNOSIS — Z01812 Encounter for preprocedural laboratory examination: Secondary | ICD-10-CM

## 2024-05-26 DIAGNOSIS — N6489 Other specified disorders of breast: Secondary | ICD-10-CM | POA: Insufficient documentation

## 2024-05-26 DIAGNOSIS — Z79899 Other long term (current) drug therapy: Secondary | ICD-10-CM

## 2024-05-26 DIAGNOSIS — D242 Benign neoplasm of left breast: Secondary | ICD-10-CM | POA: Insufficient documentation

## 2024-05-26 DIAGNOSIS — I10 Essential (primary) hypertension: Secondary | ICD-10-CM | POA: Diagnosis not present

## 2024-05-26 DIAGNOSIS — T502X5A Adverse effect of carbonic-anhydrase inhibitors, benzothiadiazides and other diuretics, initial encounter: Secondary | ICD-10-CM

## 2024-05-26 HISTORY — PX: BREAST LUMPECTOMY WITH RADIO FREQUENCY LOCALIZER: SHX6897

## 2024-05-26 LAB — POCT I-STAT, CHEM 8
BUN: 17 mg/dL (ref 8–23)
Calcium, Ion: 1.23 mmol/L (ref 1.15–1.40)
Chloride: 102 mmol/L (ref 98–111)
Creatinine, Ser: 1.1 mg/dL — ABNORMAL HIGH (ref 0.44–1.00)
Glucose, Bld: 113 mg/dL — ABNORMAL HIGH (ref 70–99)
HCT: 38 % (ref 36.0–46.0)
Hemoglobin: 12.9 g/dL (ref 12.0–15.0)
Potassium: 3.4 mmol/L — ABNORMAL LOW (ref 3.5–5.1)
Sodium: 141 mmol/L (ref 135–145)
TCO2: 26 mmol/L (ref 22–32)

## 2024-05-26 SURGERY — BREAST LUMPECTOMY WITH RADIO FREQUENCY LOCALIZER
Anesthesia: General | Site: Breast | Laterality: Left

## 2024-05-26 MED ORDER — LACTATED RINGERS IV SOLN: INTRAVENOUS | Status: DC

## 2024-05-26 MED ORDER — ONDANSETRON HCL 4 MG/2ML IJ SOLN
INTRAMUSCULAR | Status: DC | PRN
  Administered 2024-05-26: 4 mg via INTRAVENOUS

## 2024-05-26 MED ORDER — FENTANYL CITRATE (PF) 100 MCG/2ML IJ SOLN: 25.0000 ug | INTRAMUSCULAR | Status: DC | PRN

## 2024-05-26 MED ORDER — ACETAMINOPHEN 10 MG/ML IV SOLN
1000.0000 mg | Freq: Once | INTRAVENOUS | Status: DC | PRN
Start: 2024-05-26 — End: 2024-05-26

## 2024-05-26 MED ORDER — CHLORHEXIDINE GLUCONATE 0.12 % MT SOLN
15.0000 mL | Freq: Once | OROMUCOSAL | Status: AC
  Administered 2024-05-26: 15 mL via OROMUCOSAL

## 2024-05-26 MED ORDER — ACETAMINOPHEN 10 MG/ML IV SOLN
INTRAVENOUS | Status: AC
  Filled 2024-05-26: qty 100

## 2024-05-26 MED ORDER — DEXAMETHASONE SODIUM PHOSPHATE 10 MG/ML IJ SOLN
INTRAMUSCULAR | Status: DC | PRN
  Administered 2024-05-26: 10 mg via INTRAVENOUS

## 2024-05-26 MED ORDER — CEFAZOLIN SODIUM-DEXTROSE 2-4 GM/100ML-% IV SOLN
INTRAVENOUS | Status: AC
  Filled 2024-05-26: qty 100

## 2024-05-26 MED ORDER — EPHEDRINE SULFATE-NACL 50-0.9 MG/10ML-% IV SOSY
PREFILLED_SYRINGE | INTRAVENOUS | Status: DC | PRN
  Administered 2024-05-26: 5 mg via INTRAVENOUS

## 2024-05-26 MED ORDER — LIDOCAINE HCL 1 % IJ SOLN
INTRAMUSCULAR | Status: DC | PRN
  Administered 2024-05-26: 22 mL
  Administered 2024-05-26: 14 mL

## 2024-05-26 MED ORDER — LIDOCAINE HCL (PF) 1 % IJ SOLN
INTRAMUSCULAR | Status: AC
  Filled 2024-05-26: qty 30

## 2024-05-26 MED ORDER — BUPIVACAINE-EPINEPHRINE (PF) 0.5% -1:200000 IJ SOLN
INTRAMUSCULAR | Status: AC
  Filled 2024-05-26: qty 10

## 2024-05-26 MED ORDER — OXYCODONE HCL 5 MG PO TABS
5.0000 mg | ORAL_TABLET | Freq: Once | ORAL | Status: AC | PRN
  Administered 2024-05-26: 5 mg via ORAL

## 2024-05-26 MED ORDER — FENTANYL CITRATE (PF) 100 MCG/2ML IJ SOLN
INTRAMUSCULAR | Status: AC
Start: 2024-05-26 — End: 2024-05-26
  Filled 2024-05-26: qty 2

## 2024-05-26 MED ORDER — STERILE WATER FOR IRRIGATION IR SOLN
Status: DC | PRN
  Administered 2024-05-26: 500 mL

## 2024-05-26 MED ORDER — ACETAMINOPHEN 325 MG PO TABS: 650.0000 mg | ORAL_TABLET | Freq: Three times a day (TID) | ORAL | 0 refills | Status: AC | PRN

## 2024-05-26 MED ORDER — DOCUSATE SODIUM 100 MG PO CAPS: 100.0000 mg | ORAL_CAPSULE | Freq: Two times a day (BID) | ORAL | 0 refills | Status: AC | PRN

## 2024-05-26 MED ORDER — OXYCODONE HCL 5 MG/5ML PO SOLN: 5.0000 mg | Freq: Once | ORAL | Status: AC | PRN

## 2024-05-26 MED ORDER — ORAL CARE MOUTH RINSE
15.0000 mL | Freq: Once | OROMUCOSAL | Status: AC
Start: 2024-05-26 — End: 2024-05-26

## 2024-05-26 MED ORDER — PROPOFOL 1000 MG/100ML IV EMUL
INTRAVENOUS | Status: AC
  Filled 2024-05-26: qty 100

## 2024-05-26 MED ORDER — PROPOFOL 10 MG/ML IV BOLUS
INTRAVENOUS | Status: DC | PRN
  Administered 2024-05-26: 100 mg via INTRAVENOUS
  Administered 2024-05-26: 125 ug/kg/min via INTRAVENOUS

## 2024-05-26 MED ORDER — CHLORHEXIDINE GLUCONATE CLOTH 2 % EX PADS: 6.0000 | MEDICATED_PAD | Freq: Once | CUTANEOUS | Status: DC

## 2024-05-26 MED ORDER — FENTANYL CITRATE (PF) 100 MCG/2ML IJ SOLN
INTRAMUSCULAR | Status: DC | PRN
  Administered 2024-05-26 (×4): 25 ug via INTRAVENOUS

## 2024-05-26 MED ORDER — OXYCODONE HCL 5 MG PO TABS
ORAL_TABLET | ORAL | Status: AC
Start: 2024-05-26 — End: 2024-05-26
  Filled 2024-05-26: qty 1

## 2024-05-26 MED ORDER — DROPERIDOL 2.5 MG/ML IJ SOLN: 0.6250 mg | Freq: Once | INTRAMUSCULAR | Status: DC | PRN

## 2024-05-26 MED ORDER — ACETAMINOPHEN 10 MG/ML IV SOLN
INTRAVENOUS | Status: DC | PRN
  Administered 2024-05-26: 1000 mg via INTRAVENOUS

## 2024-05-26 MED ORDER — GLYCOPYRROLATE 0.2 MG/ML IJ SOLN
INTRAMUSCULAR | Status: DC | PRN
  Administered 2024-05-26: .2 mg via INTRAVENOUS

## 2024-05-26 MED ORDER — TRAMADOL HCL 50 MG PO TABS: 50.0000 mg | ORAL_TABLET | Freq: Three times a day (TID) | ORAL | 0 refills | Status: AC | PRN

## 2024-05-26 MED ORDER — LIDOCAINE HCL (CARDIAC) PF 100 MG/5ML IV SOSY
PREFILLED_SYRINGE | INTRAVENOUS | Status: DC | PRN
  Administered 2024-05-26: 100 mg via INTRAVENOUS

## 2024-05-26 MED ORDER — CHLORHEXIDINE GLUCONATE 0.12 % MT SOLN
OROMUCOSAL | Status: AC
  Filled 2024-05-26: qty 15

## 2024-05-26 MED ORDER — CEFAZOLIN SODIUM-DEXTROSE 2-4 GM/100ML-% IV SOLN
2.0000 g | INTRAVENOUS | Status: AC
  Administered 2024-05-26: 2 g via INTRAVENOUS

## 2024-05-26 SURGICAL SUPPLY — 30 items
BLADE PHOTON ILLUMINATED (MISCELLANEOUS) ×1 IMPLANT
BLADE SURG 15 STRL LF DISP TIS (BLADE) ×1 IMPLANT
CHLORAPREP W/TINT 26 (MISCELLANEOUS) IMPLANT
DERMABOND ADVANCED .7 DNX12 (GAUZE/BANDAGES/DRESSINGS) ×1 IMPLANT
DEVICE DUBIN SPECIMEN MAMMOGRA (MISCELLANEOUS) ×1 IMPLANT
DRAPE LAPAROTOMY TRNSV 106X77 (MISCELLANEOUS) ×1 IMPLANT
ELECTRODE REM PT RTRN 9FT ADLT (ELECTROSURGICAL) ×1 IMPLANT
GAUZE 4X4 16PLY ~~LOC~~+RFID DBL (SPONGE) IMPLANT
GLOVE BIOGEL PI IND STRL 7.0 (GLOVE) ×1 IMPLANT
GLOVE SURG SYN 6.5 PF PI (GLOVE) ×3 IMPLANT
GOWN STRL REUS W/ TWL LRG LVL3 (GOWN DISPOSABLE) ×3 IMPLANT
KIT MARKER MARGIN INK (KITS) ×1 IMPLANT
KIT TURNOVER KIT A (KITS) ×1 IMPLANT
LABEL OR SOLS (LABEL) ×1 IMPLANT
LIGHT WAVEGUIDE WIDE FLAT (MISCELLANEOUS) IMPLANT
MANIFOLD NEPTUNE II (INSTRUMENTS) ×1 IMPLANT
MARKER MARGIN CORRECT CLIP (MARKER) ×1 IMPLANT
NDL HYPO 22X1.5 SAFETY MO (MISCELLANEOUS) ×2 IMPLANT
NEEDLE HYPO 22X1.5 SAFETY MO (MISCELLANEOUS) ×1 IMPLANT
PACK BASIN MINOR ARMC (MISCELLANEOUS) ×1 IMPLANT
SHEATH BREAST BIOPSY SKIN MKR (SHEATH) ×1 IMPLANT
SUT SILK 2-0 30XBRD TIE 12 (SUTURE) IMPLANT
SUT SILK 3 0 12 30 (SUTURE) IMPLANT
SUT VIC AB 3-0 SH 27X BRD (SUTURE) ×1 IMPLANT
SUTURE MNCRL 4-0 27XMF (SUTURE) ×1 IMPLANT
SYR 20ML LL LF (SYRINGE) ×1 IMPLANT
TRAP FLUID SMOKE EVACUATOR (MISCELLANEOUS) ×1 IMPLANT
TRAP NEPTUNE SPECIMEN COLLECT (MISCELLANEOUS) ×1 IMPLANT
WATER STERILE IRR 1000ML POUR (IV SOLUTION) ×1 IMPLANT
WATER STERILE IRR 500ML POUR (IV SOLUTION) ×1 IMPLANT

## 2024-05-26 NOTE — Op Note (Signed)
 Preoperative diagnosis: Left breast complex sclerosing lesion  Postoperative diagnosis: Same.   Procedure: SCOUT tag-localized left breast lumpectomy Anesthesia: GETA  Surgeon: Dr. Henriette Pierre  Wound Classification: Clean  Indications: Patient is a 78 y.o. female with a nonpalpable left breast mass noted on mammography with core biopsy demonstrating complex sclerosing lesion requires SCOUT localizer placement, lumpectomy to confirm biopsy results.   Specimen: Left breast mass, lateral superior posterior margin Complications: None  Estimated Blood Loss: 25 mL  Findings: 1. Specimen mammography shows marker and SCOUT localizer on specimen 2. Pathology call refers gross examination of margins was questionable at lateral superior posterior margin area, additional tissue resected 3. No other palpable mass   Description of procedure: SCOUT localization was performed by radiology prior to procedure. In the nuclear medicine suite, the subareolar region was injected with Tc-99 sulfur colloid the morning of procedure. Localization studies were reviewed. The patient was taken to the operating room and placed supine on the operating table, and after general anesthesia the left breast and axilla were prepped and draped in the usual sterile fashion. A time-out was completed verifying correct patient, procedure, site, positioning, and implant(s) and/or special equipment prior to beginning this procedure.  By identifying the SCOUT localizer, the probable trajectory and location of the mass was visualized. A skin incision was planned in such a way as to minimize the amount of dissection to reach the mass.  The skin incision was made after infusion of local. Flaps were raised and  Sharp and blunt dissection was then taken down to the mass, taking care to include the entire SCOUT localizer and a margin of grossly normal tissue. The specimen was removed. The specimen was oriented with paint. Imaging reviewed  and the entire target lesion had been resected, with biopsy clip and localizer within the specimen.Gross margin analysis by pathology noted questionable area lateral superior posterior margin.  This area was additionally resected, painted for orientation, passed off field pending pathology.    Wound irrigated, hemostasis was achieved and the wound closed in layers with  interrupted sutures of 3-0 Vicryl in deep dermal layer and a running subcuticular suture of Monocryl 4-0, then dressed with dermabond. The patient tolerated the procedure well and was taken to the postanesthesia care unit in stable condition. Sponge and instrument count correct at end of procedure.

## 2024-05-26 NOTE — Discharge Instructions (Signed)
 Removal, Care After This sheet gives you information about how to care for yourself after your procedure. Your health care provider may also give you more specific instructions. If you have problems or questions, contact your health care provider. What can I expect after the procedure? After the procedure, it is common to have: Soreness. Bruising. Itching. Follow these instructions at home: site care Follow instructions from your health care provider about how to take care of your site. Make sure you: Wash your hands with soap and water before and after you change your bandage (dressing). If soap and water are not available, use hand sanitizer. Leave stitches (sutures), skin glue, or adhesive strips in place. These skin closures may need to stay in place for 2 weeks or longer. If adhesive strip edges start to loosen and curl up, you may trim the loose edges. Do not remove adhesive strips completely unless your health care provider tells you to do that. If the area bleeds or bruises, apply gentle pressure for 10 minutes. OK TO SHOWER IN 24HRS  Check your site every day for signs of infection. Check for: Redness, swelling, or pain. Fluid or blood. Warmth. Pus or a bad smell.  General instructions Rest and then return to your normal activities as told by your health care provider.  tylenol and advil as needed for discomfort.  Please alternate between the two every four hours as needed for pain.    Use narcotics, if prescribed, only when tylenol and motrin is not enough to control pain.  325-650mg  every 8hrs to max of 3000mg /24hrs (including the 325mg  in every norco dose) for the tylenol.    Advil up to 800mg  per dose every 8hrs as needed for pain.   Keep all follow-up visits as told by your health care provider. This is important. Contact a health care provider if: You have redness, swelling, or pain around your site. You have fluid or blood coming from your site. Your site feels warm to  the touch. You have pus or a bad smell coming from your site. You have a fever. Your sutures, skin glue, or adhesive strips loosen or come off sooner than expected. Get help right away if: You have bleeding that does not stop with pressure or a dressing. Summary After the procedure, it is common to have some soreness, bruising, and itching at the site. Follow instructions from your health care provider about how to take care of your site. Check your site every day for signs of infection. Contact a health care provider if you have redness, swelling, or pain around your site, or your site feels warm to the touch. Keep all follow-up visits as told by your health care provider. This is important. This information is not intended to replace advice given to you by your health care provider. Make sure you discuss any questions you have with your health care provider. Document Released: 11/16/2015 Document Revised: 04/19/2018 Document Reviewed: 04/19/2018 Elsevier Interactive Patient Education  [employer].

## 2024-05-26 NOTE — Anesthesia Postprocedure Evaluation (Signed)
 Anesthesia Post Note  Patient: Angela Mccall  Procedure(s) Performed: BREAST LUMPECTOMY WITH RADIO FREQUENCY LOCALIZER (Left: Breast)  Patient location during evaluation: PACU Anesthesia Type: General Level of consciousness: awake and alert Pain management: pain level controlled Vital Signs Assessment: post-procedure vital signs reviewed and stable Respiratory status: spontaneous breathing, nonlabored ventilation, respiratory function stable and patient connected to nasal cannula oxygen Cardiovascular status: blood pressure returned to baseline and stable Postop Assessment: no apparent nausea or vomiting Anesthetic complications: no   There were no known notable events for this encounter.   Last Vitals:  Vitals:   05/26/24 0900 05/26/24 0915  BP: (!) 130/58 (!) 138/55  Pulse: 72 64  Resp: 17 13  Temp:    SpO2: 99% 98%    Last Pain:  Vitals:   05/26/24 0900  TempSrc:   PainSc: 0-No pain                 Debby Mines

## 2024-05-26 NOTE — Anesthesia Preprocedure Evaluation (Addendum)
 Anesthesia Evaluation  Patient identified by MRN, date of birth, ID band Patient awake    Reviewed: Allergy & Precautions, H&P , NPO status , Patient's Chart, lab work & pertinent test results  Airway Mallampati: II  TM Distance: >3 FB Neck ROM: full    Dental no notable dental hx.    Pulmonary asthma , COPD, former smoker   Pulmonary exam normal        Cardiovascular hypertension, Normal cardiovascular exam   She underwent ETT Myoview 12/03/2017 which revealed evidence for apical wall ischemia.  She underwent cardiac catheterization 12/31/2017 which revealed normal coronary anatomy, normal left ventricular function.  24-hour Holter monitor 07/22/2018 revealed predominant sinus rhythm with mean heart rate of 65 bpm, occasional premature atrial contractions, infrequent atrial runs the longest lasting 16 beats.   Neuro/Psych negative neurological ROS  negative psych ROS   GI/Hepatic Neg liver ROS,,,Carcinoid tumor of colon    Endo/Other  negative endocrine ROS    Renal/GU      Musculoskeletal   Abdominal  (+) + obese  Peds  Hematology negative hematology ROS (+)   Anesthesia Other Findings Past Medical History: No date: Asthma No date: COPD (chronic obstructive pulmonary disease) (HCC) No date: Dyspnea No date: Essential hypertension No date: Hemorrhoids No date: History of uterine fibroid No date: Hyperlipidemia, unspecified hyperlipidemia type No date: Hypertension 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) No date: Seasonal allergies No date: Seasonal allergies No date: Synovitis No date: Tubular adenoma of colon  Past Surgical History: No date: ABDOMINAL HYSTERECTOMY 04/28/2024: BREAST BIOPSY; Left     Comment:  US  LT BREAST BX W LOC DEV 1ST LESION IMG BX SPEC US                GUIDE 04/28/2024 [facility] 05/12/2024: BREAST BIOPSY     Comment:  MM LT BREAST SAVI/RF TAG 1ST LESION MAMMO GUIDE                05/12/2024 [facility] 2021: CATARACT EXTRACTION, BILATERAL; Bilateral No date: COLONOSCOPY 02/10/2017: COLONOSCOPY WITH PROPOFOL ; N/A     Comment:  Procedure: COLONOSCOPY WITH PROPOFOL ;  Surgeon: Gladis RAYMOND Mariner, MD;  Location: [facility] ENDOSCOPY;  Service:               Endoscopy;  Laterality: N/A; 06/02/2022: COLONOSCOPY WITH PROPOFOL ; N/A     Comment:  Procedure: COLONOSCOPY WITH PROPOFOL ;  Surgeon:               Maryruth Ole DASEN, MD;  Location: [facility] ENDOSCOPY;                Service: Endoscopy;  Laterality: N/A; No date: EYE SURGERY 12/31/2017: LEFT HEART CATH AND CORONARY ANGIOGRAPHY; Left     Comment:  Procedure: LEFT HEART CATH AND CORONARY ANGIOGRAPHY;                Surgeon: Ammon Blunt, MD;  Location: [facility]               INVASIVE CV LAB;  Service: Cardiovascular;  Laterality:               Left; 05/04/2020: MINOR HARDWARE REMOVAL; Bilateral     Comment:  Procedure: HARDWARE REMOVAL BILATERAL FEET;  Surgeon:               Lennie Barter, DPM;  Location: [facility] ORS;  Service:  Podiatry;  Laterality: Bilateral; 1995: NASAL SINUS SURGERY No date: pneumovax  BMI    Body Mass Index: 34.58 kg/m      Reproductive/Obstetrics negative OB ROS                              Anesthesia Physical Anesthesia Plan  ASA: 3  Anesthesia Plan: General LMA   Post-op Pain Management: Toradol IV (intra-op)* and Ofirmev  IV (intra-op)*   Induction: Intravenous  PONV Risk Score and Plan: Dexamethasone , Ondansetron , Midazolam  and Treatment may vary due to age or medical condition  Airway Management Planned: LMA  Additional Equipment:   Intra-op Plan:   Post-operative Plan: Extubation in OR  Informed Consent: I have reviewed the patients History and Physical, chart, labs and discussed the procedure including the risks, benefits and alternatives for the proposed anesthesia with the patient or authorized representative who  has indicated his/her understanding and acceptance.     Dental Advisory Given  Plan Discussed with: Anesthesiologist, CRNA and Surgeon  Anesthesia Plan Comments:          Anesthesia Quick Evaluation

## 2024-05-26 NOTE — Transfer of Care (Signed)
 Immediate Anesthesia Transfer of Care Note  Patient: Angela Mccall  Procedure(s) Performed: BREAST LUMPECTOMY WITH RADIO FREQUENCY LOCALIZER (Left: Breast)  Patient Location: PACU  Anesthesia Type:General  Level of Consciousness: drowsy and patient cooperative  Airway & Oxygen Therapy: Patient Spontanous Breathing and Patient connected to nasal cannula oxygen  Post-op Assessment: Report given to RN and Post -op Vital signs reviewed and stable  Post vital signs: stable  Last Vitals:  Vitals Value Taken Time  BP    Temp    Pulse 52 05/26/24 08:44  Resp 11 05/26/24 08:44  SpO2 98 % 05/26/24 08:44  Vitals shown include unfiled device data.  Last Pain:  Vitals:   05/26/24 0621  TempSrc: Oral  PainSc: 0-No pain         Complications: No notable events documented.

## 2024-05-26 NOTE — Interval H&P Note (Signed)
 No change. OK to proceed.

## 2024-05-26 NOTE — Anesthesia Procedure Notes (Signed)
 Procedure Name: LMA Insertion Date/Time: 05/26/2024 7:39 AM  Performed by: Norleen Alberta HERO., CRNAPre-anesthesia Checklist: Patient identified, Patient being monitored, Timeout performed, Emergency Drugs available and Suction available Patient Re-evaluated:Patient Re-evaluated prior to induction Oxygen Delivery Method: Circle system utilized Preoxygenation: Pre-oxygenation with 100% oxygen Induction Type: IV induction Ventilation: Mask ventilation without difficulty LMA: LMA inserted LMA Size: 3.0 Tube type: Oral Number of attempts: 1 Placement Confirmation: positive ETCO2 and breath sounds checked- equal and bilateral Tube secured with: Tape Dental Injury: Teeth and Oropharynx as per pre-operative assessment

## 2024-05-27 ENCOUNTER — Encounter: Payer: Self-pay | Admitting: Surgery

## 2024-05-30 LAB — SURGICAL PATHOLOGY
